# Patient Record
Sex: Male | Born: 1955 | Race: Black or African American | Hispanic: No | Smoking: Current some day smoker
Health system: Southern US, Community
[De-identification: ages and names within clinical notes are randomized; demographics above are authoritative.]

## PROBLEM LIST (undated history)

## (undated) DIAGNOSIS — F329 Major depressive disorder, single episode, unspecified: Secondary | ICD-10-CM

## (undated) DIAGNOSIS — E785 Hyperlipidemia, unspecified: Secondary | ICD-10-CM

## (undated) DIAGNOSIS — M545 Low back pain, unspecified: Secondary | ICD-10-CM

## (undated) DIAGNOSIS — F32A Depression, unspecified: Secondary | ICD-10-CM

## (undated) DIAGNOSIS — R7303 Prediabetes: Secondary | ICD-10-CM

## (undated) DIAGNOSIS — R4189 Other symptoms and signs involving cognitive functions and awareness: Secondary | ICD-10-CM

---

## 2004-06-30 ENCOUNTER — Ambulatory Visit: Payer: Self-pay

## 2004-07-03 ENCOUNTER — Ambulatory Visit: Payer: Self-pay | Admitting: Unknown Physician Specialty

## 2005-12-16 ENCOUNTER — Ambulatory Visit: Payer: Self-pay

## 2008-01-01 IMAGING — CR DG LUMBAR SPINE 2-3V
1 series · 3 of 3 positions shown · non-contrast
Comparison: none

REASON FOR EXAM: pain, Nilima Akter Torki [id number] ext. [id number]
COMMENTS:

PROCEDURE:     DXR - DXR LUMBAR SPINE AP AND LATERAL  - December 16, 2005 [DATE]
RESULT:      There does not appear to be evidence of fracture, dislocation
or malalignment.  If there is persistent clinical concern or persistent
complaints of pain, further evaluation with lumbar MRI is recommended.

[Series 1: view not recorded · 0.17mm/px · 3 of 3 slices shown]
[im 1/3]
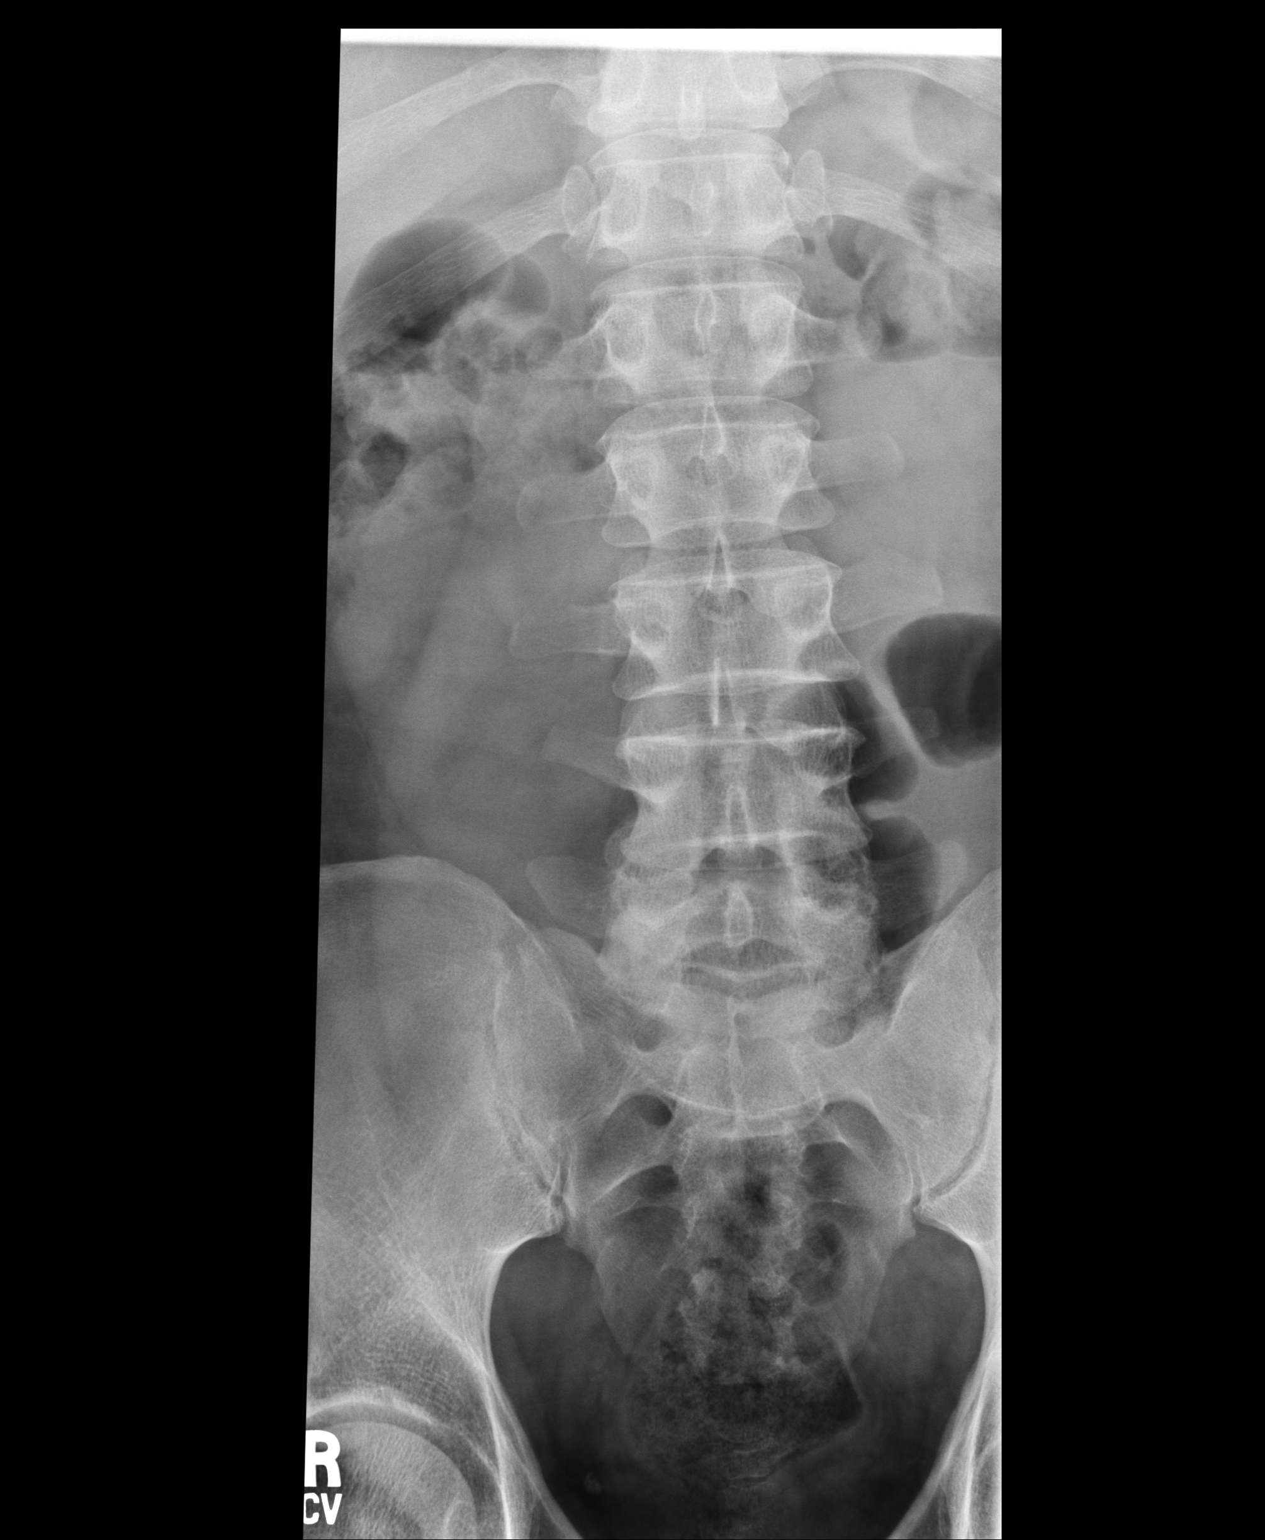
[im 2/3]
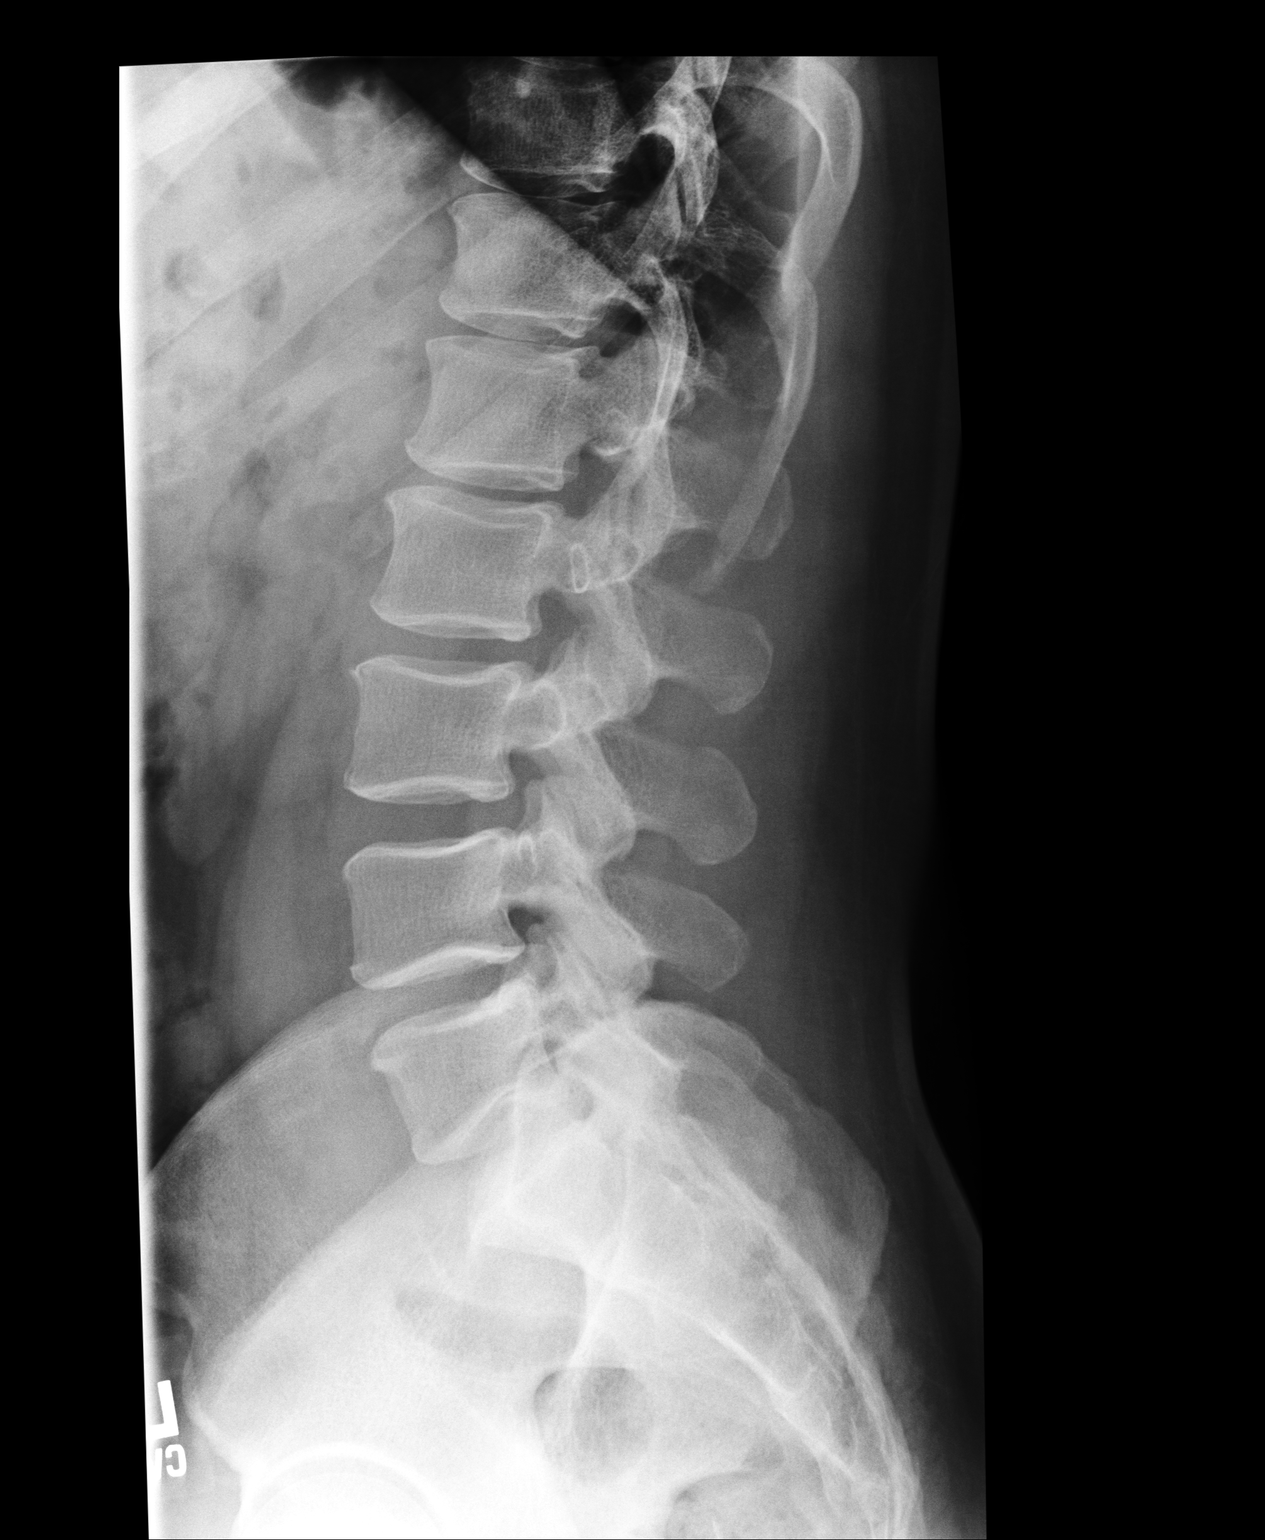
[im 3/3]
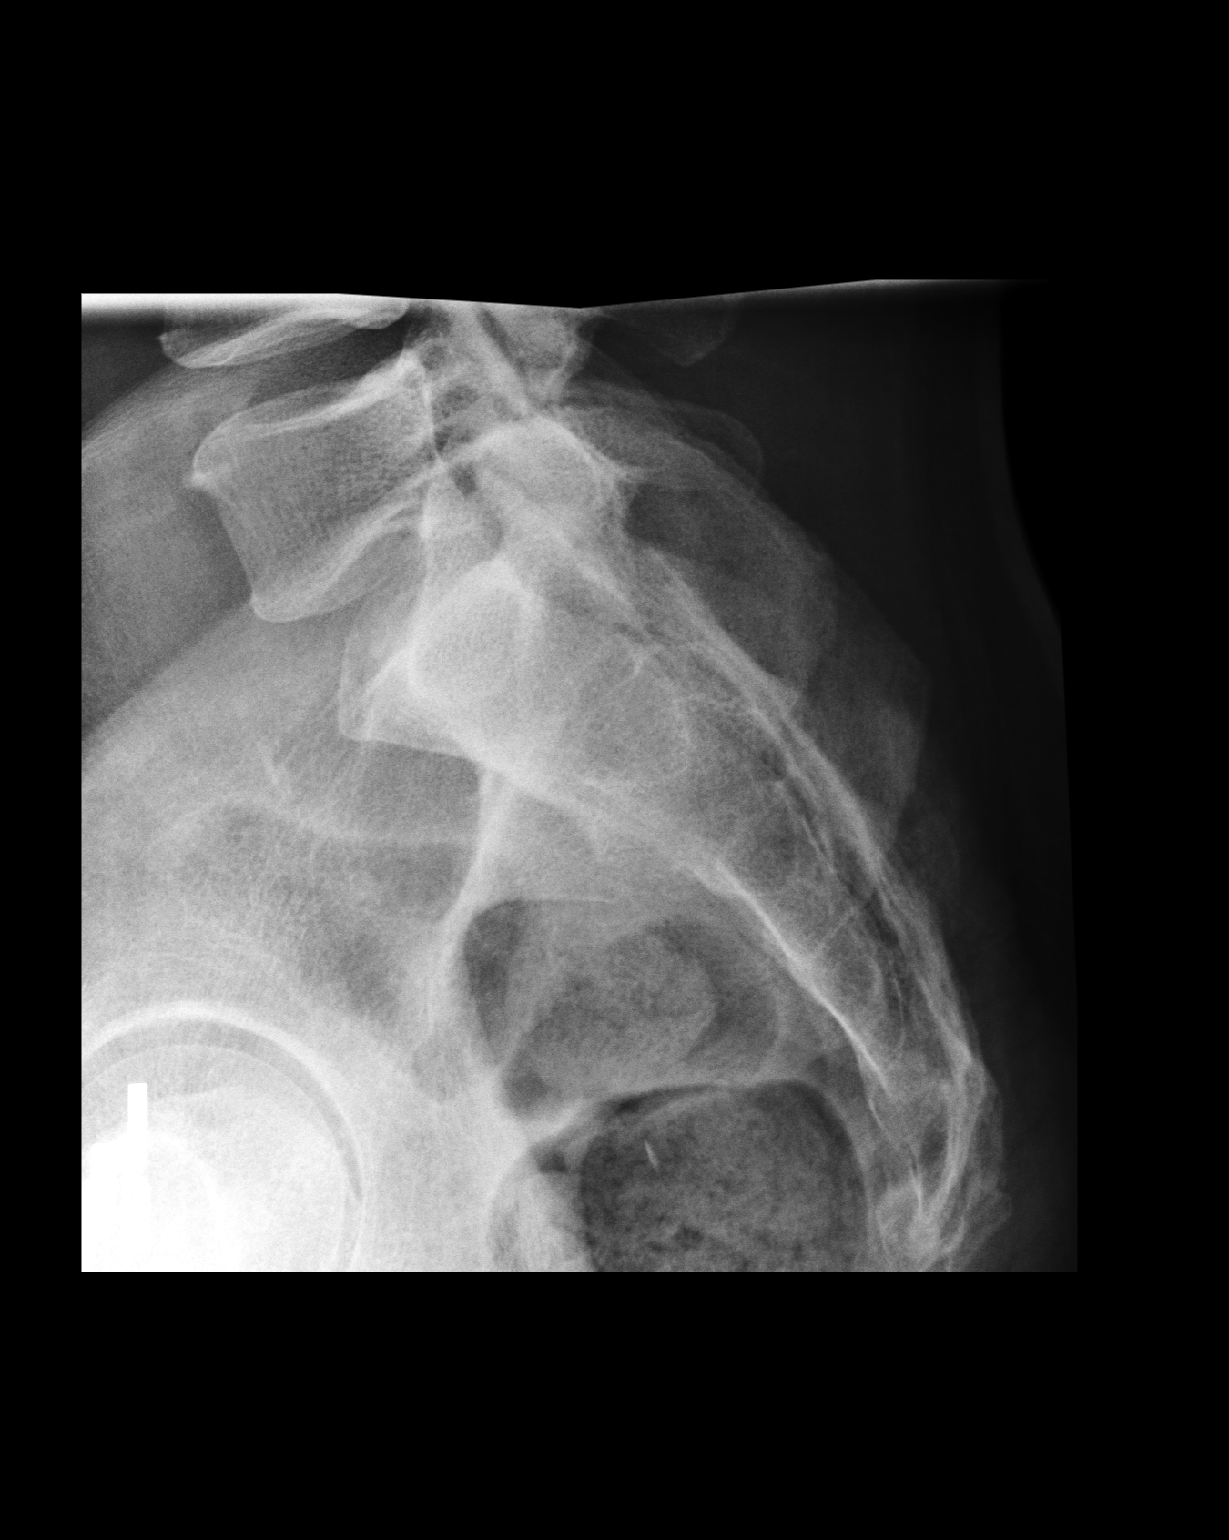

[3 of 3 positions shown; findings below may reference images not displayed]

IMPRESSION: Two views of the lumbar spine without evidence of acute osseous
abnormalities.

## 2008-01-24 ENCOUNTER — Ambulatory Visit: Payer: Self-pay

## 2016-01-19 ENCOUNTER — Ambulatory Visit
Admission: RE | Admit: 2016-01-19 | Payer: Medicare (Managed Care) | Source: Ambulatory Visit | Admitting: Unknown Physician Specialty

## 2016-01-19 ENCOUNTER — Encounter: Admission: RE | Payer: Self-pay | Source: Ambulatory Visit

## 2016-01-19 SURGERY — COLONOSCOPY WITH PROPOFOL
Anesthesia: General

## 2016-04-09 ENCOUNTER — Encounter: Payer: Self-pay | Admitting: *Deleted

## 2016-04-14 ENCOUNTER — Ambulatory Visit: Payer: Medicare (Managed Care) | Admitting: Certified Registered Nurse Anesthetist

## 2016-04-14 ENCOUNTER — Ambulatory Visit
Admission: RE | Admit: 2016-04-14 | Discharge: 2016-04-14 | Disposition: A | Discharge status: Home / Self Care | Payer: Medicare (Managed Care) | Source: Ambulatory Visit | Attending: Unknown Physician Specialty | Admitting: Unknown Physician Specialty

## 2016-04-14 ENCOUNTER — Encounter: Payer: Self-pay | Admitting: *Deleted

## 2016-04-14 ENCOUNTER — Encounter
Admission: RE | Disposition: A | Discharge status: Home / Self Care | Payer: Self-pay | Source: Ambulatory Visit | Attending: Unknown Physician Specialty

## 2016-04-14 DIAGNOSIS — Z791 Long term (current) use of non-steroidal anti-inflammatories (NSAID): Secondary | ICD-10-CM | POA: Insufficient documentation

## 2016-04-14 DIAGNOSIS — Z539 Procedure and treatment not carried out, unspecified reason: Secondary | ICD-10-CM | POA: Diagnosis not present

## 2016-04-14 DIAGNOSIS — Z6834 Body mass index (BMI) 34.0-34.9, adult: Secondary | ICD-10-CM | POA: Diagnosis not present

## 2016-04-14 DIAGNOSIS — F17290 Nicotine dependence, other tobacco product, uncomplicated: Secondary | ICD-10-CM | POA: Diagnosis not present

## 2016-04-14 DIAGNOSIS — R195 Other fecal abnormalities: Secondary | ICD-10-CM | POA: Insufficient documentation

## 2016-04-14 HISTORY — DX: Hyperlipidemia, unspecified: E78.5

## 2016-04-14 HISTORY — DX: Low back pain: M54.5

## 2016-04-14 HISTORY — DX: Prediabetes: R73.03

## 2016-04-14 HISTORY — DX: Other symptoms and signs involving cognitive functions and awareness: R41.89

## 2016-04-14 HISTORY — DX: Low back pain, unspecified: M54.50

## 2016-04-14 HISTORY — PX: COLONOSCOPY WITH PROPOFOL: SHX5780

## 2016-04-14 SURGERY — COLONOSCOPY WITH PROPOFOL
Anesthesia: General

## 2016-04-14 MED ORDER — SODIUM CHLORIDE 0.9 % IV SOLN: INTRAVENOUS | Status: DC

## 2016-04-14 MED ORDER — PROPOFOL 10 MG/ML IV BOLUS
INTRAVENOUS | Status: DC | PRN
  Administered 2016-04-14: 30 mg via INTRAVENOUS

## 2016-04-14 MED ORDER — SODIUM CHLORIDE 0.9 % IV SOLN
INTRAVENOUS | Status: DC
  Administered 2016-04-14: 1000 mL via INTRAVENOUS

## 2016-04-14 MED ORDER — MIDAZOLAM HCL 2 MG/2ML IJ SOLN
INTRAMUSCULAR | Status: DC | PRN
  Administered 2016-04-14 (×2): 1 mg via INTRAVENOUS

## 2016-04-14 MED ORDER — PROPOFOL 500 MG/50ML IV EMUL
INTRAVENOUS | Status: DC | PRN
  Administered 2016-04-14: 160 ug/kg/min via INTRAVENOUS

## 2016-04-14 MED ORDER — LIDOCAINE 2% (20 MG/ML) 5 ML SYRINGE
INTRAMUSCULAR | Status: AC
  Filled 2016-04-14: qty 5

## 2016-04-14 MED ORDER — MIDAZOLAM HCL 2 MG/2ML IJ SOLN
INTRAMUSCULAR | Status: AC
  Filled 2016-04-14: qty 2

## 2016-04-14 MED ORDER — LIDOCAINE HCL (CARDIAC) 20 MG/ML IV SOLN
INTRAVENOUS | Status: DC | PRN
  Administered 2016-04-14: 50 mg via INTRAVENOUS

## 2016-04-14 MED ORDER — PROPOFOL 500 MG/50ML IV EMUL
INTRAVENOUS | Status: AC
  Filled 2016-04-14: qty 50

## 2016-04-14 NOTE — Anesthesia Postprocedure Evaluation (Signed)
Anesthesia Post Note  Patient: Jason Baxter  Procedure(s) Performed: Procedure(s) (LRB): COLONOSCOPY WITH PROPOFOL (N/A)  Patient location during evaluation: PACU Anesthesia Type: General Level of consciousness: awake and alert Pain management: pain level controlled Vital Signs Assessment: post-procedure vital signs reviewed and stable Respiratory status: spontaneous breathing, nonlabored ventilation, respiratory function stable and patient connected to nasal cannula oxygen Cardiovascular status: blood pressure returned to baseline and stable Postop Assessment: no signs of nausea or vomiting Anesthetic complications: no     Last Vitals:  Vitals:   04/14/16 1537 04/14/16 1547  BP: (!) 94/44 (!) 101/55  Pulse: 64 (!) 57  Resp: 17 14  Temp: (!) 35.9 C     Last Pain:  Vitals:   04/14/16 1537  TempSrc: Tympanic                 Toney Lizaola S

## 2016-04-14 NOTE — Anesthesia Preprocedure Evaluation (Signed)
Anesthesia Evaluation  Patient identified by MRN, date of birth, ID band Patient awake    Reviewed: Allergy & Precautions, NPO status , Patient's Chart, lab work & pertinent test results  Airway Mallampati: I       Dental  (+) Teeth Intact   Pulmonary neg pulmonary ROS, Current Smoker,    Pulmonary exam normal        Cardiovascular Exercise Tolerance: Good  Rhythm:Regular Rate:Normal     Neuro/Psych    GI/Hepatic negative GI ROS, Neg liver ROS,   Endo/Other  negative endocrine ROSMorbid obesity  Renal/GU negative Renal ROS     Musculoskeletal negative musculoskeletal ROS (+)   Abdominal (+) + obese,   Peds negative pediatric ROS (+)  Hematology   Anesthesia Other Findings   Reproductive/Obstetrics                             Anesthesia Physical Anesthesia Plan  ASA: II  Anesthesia Plan: General   Post-op Pain Management:    Induction: Intravenous  Airway Management Planned: Natural Airway and Nasal Cannula  Additional Equipment:   Intra-op Plan:   Post-operative Plan:   Informed Consent: I have reviewed the patients History and Physical, chart, labs and discussed the procedure including the risks, benefits and alternatives for the proposed anesthesia with the patient or authorized representative who has indicated his/her understanding and acceptance.     Plan Discussed with: CRNA  Anesthesia Plan Comments:         Anesthesia Quick Evaluation

## 2016-04-14 NOTE — Op Note (Signed)
[facility] Gastroenterology Patient Name: Jason Baxter Procedure Date: 04/14/2016 3:20 PM MRN: [id number] Account #: [id number] Date of Birth: Feb 10, 1956 Admit Type: Outpatient Age: 60 Room: [facility] ENDO ROOM 4 Gender: Male Note Status: Finalized Procedure:            Colonoscopy Providers:            Manya Silvas, MD Referring MD:         Welford Roche, MD (Referring MD) Complications:        No immediate complications. Procedure:            Pre-Anesthesia Assessment:                       - After reviewing the risks and benefits, the patient                        was deemed in satisfactory condition to undergo the                        procedure.                       After obtaining informed consent, the colonoscope was                        passed under direct vision. Throughout the procedure,                        the patient's blood pressure, pulse, and oxygen                        saturations were monitored continuously. The                        Colonoscope was introduced through the anus with the                        intention of advancing to the cecum. The scope was                        advanced to the rectum before the procedure was                        aborted. Medications were given. The colonoscopy was                        aborted due to poor bowel prep with stool present. The                        colonoscopy was performed with ease. The patient                        tolerated the procedure well. The quality of the bowel                        preparation was unsatisfactory. Findings:      Thick layer of stool covering the rectum. Procedure aborted.      Semi-liquid stool was found in the rectum, precluding visualization. Impression:           - The procedure was  aborted due to poor bowel prep with                        stool present.                       - Preparation of the colon was unsatisfactory.                       -  No specimens collected. Recommendation:       - The findings and recommendations were discussed with                        the patient. Manya Silvas, MD 04/14/2016 3:35:24 PM This report has been signed electronically. Number of Addenda: 0 Note Initiated On: 04/14/2016 3:20 PM Total Procedure Duration: 0 hours 0 minutes 52 seconds       [facility]

## 2016-04-14 NOTE — Anesthesia Procedure Notes (Signed)
Date/Time: 04/14/2016 3:23 PM Performed by: Johnna Acosta Pre-anesthesia Checklist: Patient identified, Emergency Drugs available, Suction available, Patient being monitored and Timeout performed Patient Re-evaluated:Patient Re-evaluated prior to inductionOxygen Delivery Method: Nasal cannula

## 2016-04-14 NOTE — Transfer of Care (Signed)
Immediate Anesthesia Transfer of Care Note  Patient: Jason Baxter  Procedure(s) Performed: Procedure(s): COLONOSCOPY WITH PROPOFOL (N/A)  Patient Location: PACU  Anesthesia Type:General  Level of Consciousness: awake  Airway & Oxygen Therapy: Patient Spontanous Breathing and Patient connected to nasal cannula oxygen  Post-op Assessment: Report given to RN and Post -op Vital signs reviewed and stable  Post vital signs: Reviewed and stable  Last Vitals:  Vitals:   04/14/16 1323 04/14/16 1537  BP: 123/74 (!) 94/44  Pulse: 67 64  Resp: 20 17  Temp: (!) 35.9 C (!) 35.9 C    Last Pain:  Vitals:   04/14/16 1537  TempSrc: Tympanic         Complications: No apparent anesthesia complications

## 2016-04-14 NOTE — H&P (Signed)
   Primary Care Physician:  No PCP Per Patient Primary Gastroenterologist:  Dr. Vira Agar  Pre-Procedure History & Physical: HPI:  Jason Baxter is a 60 y.o. male is here for an colonoscopy.   Past Medical History:  Diagnosis Date  . Cognitive impairment   . Hyperlipemia   . Low back pain   . Pre-diabetes     History reviewed. No pertinent surgical history.  Prior to Admission medications   Medication Sig Start Date End Date Taking? Authorizing Provider  acetaminophen (TYLENOL) 500 MG tablet Take 500 mg by mouth every 6 (six) hours as needed.   Yes Historical Provider, MD  naproxen sodium (ANAPROX) 220 MG tablet Take 220 mg by mouth 2 (two) times daily with a meal.   Yes Historical Provider, MD  polyethylene glycol powder (GLYCOLAX/MIRALAX) powder Take 1 Container by mouth once.   Yes Historical Provider, MD    Allergies as of 04/05/2016  . (Not on File)    History reviewed. No pertinent family history.  Social History   Social History  . Marital status: Divorced    Spouse name: N/A  . Number of children: N/A  . Years of education: N/A   Occupational History  . Not on file.   Social History Main Topics  . Smoking status: Current Every Day Smoker    Types: Cigars  . Smokeless tobacco: Never Used  . Alcohol use No  . Drug use: No  . Sexual activity: Not on file   Other Topics Concern  . Not on file   Social History Narrative  . No narrative on file    Review of Systems: See HPI, otherwise negative ROS  Physical Exam: BP 123/74   Pulse 67   Temp (!) 96.6 F (35.9 C) (Tympanic)   Resp 20   Ht 6' (1.829 m)   Wt 115.7 kg (255 lb)   SpO2 99%   BMI 34.58 kg/m  General:   Alert,  pleasant and cooperative in NAD Head:  Normocephalic and atraumatic. Neck:  Supple; no masses or thyromegaly. Lungs:  Clear throughout to auscultation.    Heart:  Regular rate and rhythm. Abdomen:  Soft, nontender and nondistended. Normal bowel sounds, without guarding, and  without rebound.   Neurologic:  Alert and  oriented x4;  grossly normal neurologically.  Impression/Plan: Jason Baxter is here for an colonoscopy to be performed for heme positive stool.  Risks, benefits, limitations, and alternatives regarding  colonoscopy have been reviewed with the patient.  Questions have been answered.  All parties agreeable.   Gaylyn Cheers, MD  04/14/2016, 3:18 PM

## 2016-04-15 ENCOUNTER — Encounter: Payer: Self-pay | Admitting: Unknown Physician Specialty

## 2016-04-20 ENCOUNTER — Encounter: Payer: Self-pay | Admitting: *Deleted

## 2016-04-21 ENCOUNTER — Ambulatory Visit: Payer: Medicare (Managed Care) | Admitting: Certified Registered"

## 2016-04-21 ENCOUNTER — Ambulatory Visit
Admission: RE | Admit: 2016-04-21 | Discharge: 2016-04-21 | Disposition: A | Discharge status: Home / Self Care | Payer: Medicare (Managed Care) | Source: Ambulatory Visit | Attending: Unknown Physician Specialty | Admitting: Unknown Physician Specialty

## 2016-04-21 ENCOUNTER — Ambulatory Visit: Payer: Medicare (Managed Care) | Admitting: Anesthesiology

## 2016-04-21 ENCOUNTER — Ambulatory Visit
Admission: RE | Admit: 2016-04-21 | Payer: Medicare (Managed Care) | Source: Ambulatory Visit | Admitting: Unknown Physician Specialty

## 2016-04-21 ENCOUNTER — Encounter
Admission: RE | Disposition: A | Discharge status: Home / Self Care | Payer: Self-pay | Source: Ambulatory Visit | Attending: Unknown Physician Specialty

## 2016-04-21 ENCOUNTER — Encounter: Payer: Self-pay | Admitting: *Deleted

## 2016-04-21 DIAGNOSIS — R195 Other fecal abnormalities: Secondary | ICD-10-CM | POA: Insufficient documentation

## 2016-04-21 DIAGNOSIS — E785 Hyperlipidemia, unspecified: Secondary | ICD-10-CM | POA: Diagnosis not present

## 2016-04-21 DIAGNOSIS — F17290 Nicotine dependence, other tobacco product, uncomplicated: Secondary | ICD-10-CM | POA: Diagnosis not present

## 2016-04-21 DIAGNOSIS — F329 Major depressive disorder, single episode, unspecified: Secondary | ICD-10-CM | POA: Diagnosis not present

## 2016-04-21 DIAGNOSIS — D123 Benign neoplasm of transverse colon: Secondary | ICD-10-CM | POA: Insufficient documentation

## 2016-04-21 DIAGNOSIS — D122 Benign neoplasm of ascending colon: Secondary | ICD-10-CM | POA: Diagnosis not present

## 2016-04-21 HISTORY — DX: Depression, unspecified: F32.A

## 2016-04-21 HISTORY — PX: COLONOSCOPY WITH PROPOFOL: SHX5780

## 2016-04-21 HISTORY — DX: Major depressive disorder, single episode, unspecified: F32.9

## 2016-04-21 SURGERY — COLONOSCOPY WITH PROPOFOL
Anesthesia: General

## 2016-04-21 MED ORDER — PROPOFOL 500 MG/50ML IV EMUL
INTRAVENOUS | Status: AC
  Filled 2016-04-21: qty 50

## 2016-04-21 MED ORDER — LIDOCAINE HCL (CARDIAC) 20 MG/ML IV SOLN
INTRAVENOUS | Status: DC | PRN
  Administered 2016-04-21: 100 mg via INTRAVENOUS

## 2016-04-21 MED ORDER — PROPOFOL 10 MG/ML IV BOLUS
INTRAVENOUS | Status: DC | PRN
  Administered 2016-04-21: 50 mg via INTRAVENOUS

## 2016-04-21 MED ORDER — PROPOFOL 10 MG/ML IV BOLUS
INTRAVENOUS | Status: AC
  Filled 2016-04-21: qty 20

## 2016-04-21 MED ORDER — PHENYLEPHRINE HCL 10 MG/ML IJ SOLN
INTRAMUSCULAR | Status: DC | PRN
  Administered 2016-04-21: 120 ug via INTRAVENOUS
  Administered 2016-04-21: 80 ug via INTRAVENOUS
  Administered 2016-04-21: 120 ug via INTRAVENOUS

## 2016-04-21 MED ORDER — SODIUM CHLORIDE 0.9 % IV SOLN: INTRAVENOUS | Status: DC

## 2016-04-21 MED ORDER — PHENYLEPHRINE 40 MCG/ML (10ML) SYRINGE FOR IV PUSH (FOR BLOOD PRESSURE SUPPORT)
PREFILLED_SYRINGE | INTRAVENOUS | Status: AC
  Filled 2016-04-21: qty 10

## 2016-04-21 MED ORDER — LIDOCAINE 2% (20 MG/ML) 5 ML SYRINGE
INTRAMUSCULAR | Status: AC
  Filled 2016-04-21: qty 5

## 2016-04-21 MED ORDER — SODIUM CHLORIDE 0.9 % IV SOLN
INTRAVENOUS | Status: DC
Start: 2016-04-21 — End: 2016-04-21
  Administered 2016-04-21 (×2): via INTRAVENOUS
  Administered 2016-04-21: 1000 mL via INTRAVENOUS

## 2016-04-21 MED ORDER — PROPOFOL 500 MG/50ML IV EMUL
INTRAVENOUS | Status: DC | PRN
  Administered 2016-04-21: 150 ug/kg/min via INTRAVENOUS

## 2016-04-21 MED ORDER — PEG 3350-KCL-NA BICARB-NACL 420 G PO SOLR
4000.0000 mL | Freq: Once | ORAL | Status: DC
  Filled 2016-04-21: qty 4000

## 2016-04-21 MED ORDER — PHENYLEPHRINE HCL 10 MG/ML IJ SOLN
INTRAMUSCULAR | Status: DC | PRN
  Administered 2016-04-21: 120 ug via INTRAVENOUS

## 2016-04-21 NOTE — H&P (Signed)
   Primary Care Physician:  No PCP Per Patient Primary Gastroenterologist:  Dr. Vira Agar  Pre-Procedure History & Physical: HPI:  Jason Baxter is a 61 y.o. male is here for an colonoscopy.   Past Medical History:  Diagnosis Date  . Cognitive impairment   . Depression   . Hyperlipemia   . Low back pain   . Pre-diabetes     Past Surgical History:  Procedure Laterality Date  . COLONOSCOPY WITH PROPOFOL N/A 04/14/2016   Procedure: COLONOSCOPY WITH PROPOFOL;  Surgeon: Manya Silvas, MD;  Location: [facility] ENDOSCOPY;  Service: Endoscopy;  Laterality: N/A;    Prior to Admission medications   Medication Sig Start Date End Date Taking? Authorizing Provider  acetaminophen (TYLENOL) 500 MG tablet Take 500 mg by mouth every 6 (six) hours as needed.    Historical Provider, MD  naproxen sodium (ANAPROX) 220 MG tablet Take 220 mg by mouth 2 (two) times daily with a meal.    Historical Provider, MD  polyethylene glycol powder (GLYCOLAX/MIRALAX) powder Take 1 Container by mouth once.    Historical Provider, MD    Allergies as of 04/20/2016  . (No Known Allergies)    History reviewed. No pertinent family history.  Social History   Social History  . Marital status: Divorced    Spouse name: N/A  . Number of children: N/A  . Years of education: N/A   Occupational History  . Not on file.   Social History Main Topics  . Smoking status: Current Some Day Smoker    Types: Cigars  . Smokeless tobacco: Never Used  . Alcohol use No  . Drug use: No  . Sexual activity: Not on file   Other Topics Concern  . Not on file   Social History Narrative  . No narrative on file    Review of Systems: See HPI, otherwise negative ROS  Physical Exam: BP 127/76   Pulse 65   Temp 97.7 F (36.5 C) (Tympanic)   Resp 16   Ht 6' (1.829 m)   Wt 115.7 kg (255 lb)   SpO2 100%   BMI 34.58 kg/m  General:   Alert,  pleasant and cooperative in NAD Head:  Normocephalic and atraumatic. Neck:   Supple; no masses or thyromegaly. Lungs:  Clear throughout to auscultation.    Heart:  Regular rate and rhythm. Abdomen:  Soft, nontender and nondistended. Normal bowel sounds, without guarding, and without rebound.   Neurologic:  Alert and  oriented x4;  grossly normal neurologically.  Impression/Plan: Jason Baxter is here for an colonoscopy to be performed for Heme positive stool.  Risks, benefits, limitations, and alternatives regarding  colonoscopy have been reviewed with the patient.  Questions have been answered.  All parties agreeable.   Gaylyn Cheers, MD  04/21/2016, 8:26 AM

## 2016-04-21 NOTE — Transfer of Care (Signed)
Immediate Anesthesia Transfer of Care Note  Patient: Jason Baxter  Procedure(s) Performed: Procedure(s): COLONOSCOPY WITH PROPOFOL (N/A)  Patient Location: Endoscopy Unit  Anesthesia Type:General  Level of Consciousness: awake, oriented and patient cooperative  Airway & Oxygen Therapy: Patient Spontanous Breathing and Patient connected to nasal cannula oxygen  Post-op Assessment: Report given to RN, Post -op Vital signs reviewed and stable and Patient moving all extremities X 4  Post vital signs: Reviewed and stable  Last Vitals:  Vitals:   04/21/16 0858 04/21/16 0903  BP: 100/63 100/63  Pulse:    Resp: 13   Temp: 36.4 C     Last Pain:  Vitals:   04/21/16 0930  TempSrc:   PainSc: 0-No pain         Complications: No apparent anesthesia complications

## 2016-04-21 NOTE — Anesthesia Postprocedure Evaluation (Signed)
Anesthesia Post Note  Patient: Jason Baxter  Procedure(s) Performed: Procedure(s) (LRB): COLONOSCOPY WITH PROPOFOL (N/A)  Patient location during evaluation: Endoscopy Anesthesia Type: General Level of consciousness: awake and alert Pain management: pain level controlled Vital Signs Assessment: post-procedure vital signs reviewed and stable Respiratory status: spontaneous breathing, nonlabored ventilation, respiratory function stable and patient connected to nasal cannula oxygen Cardiovascular status: blood pressure returned to baseline and stable Postop Assessment: no signs of nausea or vomiting Anesthetic complications: no     Last Vitals:  Vitals:   04/21/16 0858 04/21/16 0903  BP: 100/63 100/63  Pulse:    Resp: 13   Temp: 36.4 C     Last Pain:  Vitals:   04/21/16 0930  TempSrc:   PainSc: 0-No pain                 Precious Haws Piscitello

## 2016-04-21 NOTE — Op Note (Signed)
[facility] Gastroenterology Patient Name: Jason Baxter Procedure Date: 04/21/2016 8:27 AM MRN: [id number] Account #: [id number] Date of Birth: 06-Mar-1956 Admit Type: Outpatient Age: 61 Room: [facility] ENDO ROOM 4 Gender: Male Note Status: Finalized Procedure:            Colonoscopy Providers:            Manya Silvas, MD Medicines:            Propofol per Anesthesia Complications:        No immediate complications. Procedure:            Pre-Anesthesia Assessment:                       - After reviewing the risks and benefits, the patient                        was deemed in satisfactory condition to undergo the                        procedure.                       After obtaining informed consent, the colonoscope was                        passed under direct vision. Throughout the procedure,                        the patient's blood pressure, pulse, and oxygen                        saturations were monitored continuously. The                        Colonoscope was introduced through the anus and                        advanced to the the cecum, identified by appendiceal                        orifice and ileocecal valve. The colonoscopy was                        somewhat difficult due to inadequate bowel prep. The                        patient tolerated the procedure well. The quality of                        the bowel preparation was poor. Findings:      A 10 mm polyp was found in the descending colon. The polyp was sessile.       Due to the poor prep we were not able to remove it for fear of gas       explosion. There was stool throughout the colon. Some chuncks and mostly       covering of colon with semi-liquid stool. Impression:           - Preparation of the colon was poor.                       -  One 10 mm polyp in the descending colon. Recommendation:       - The findings and recommendations were discussed with                        the  patient's family. Recommend drink prep this morning                        and do colonoscopy in later afternoon. Manya Silvas, MD 04/21/2016 8:56:24 AM This report has been signed electronically. Number of Addenda: 0 Note Initiated On: 04/21/2016 8:27 AM Scope Withdrawal Time: 0 hours 9 minutes 50 seconds  Total Procedure Duration: 0 hours 15 minutes 38 seconds       [facility]

## 2016-04-21 NOTE — Anesthesia Preprocedure Evaluation (Signed)
Anesthesia Evaluation  Patient identified by MRN, date of birth, ID band Patient awake    Reviewed: Allergy & Precautions, H&P , NPO status , Patient's Chart, lab work & pertinent test results  History of Anesthesia Complications Negative for: history of anesthetic complications  Airway Mallampati: III  TM Distance: >3 FB Neck ROM: full    Dental  (+) Poor Dentition, Missing, Edentulous Upper, Edentulous Lower   Pulmonary neg shortness of breath, Current Smoker,    Pulmonary exam normal breath sounds clear to auscultation       Cardiovascular Exercise Tolerance: Good (-) angina(-) Past MI and (-) DOE negative cardio ROS Normal cardiovascular exam Rhythm:regular Rate:Normal     Neuro/Psych PSYCHIATRIC DISORDERS negative neurological ROS     GI/Hepatic negative GI ROS, Neg liver ROS, neg GERD  ,  Endo/Other  negative endocrine ROS  Renal/GU negative Renal ROS  negative genitourinary   Musculoskeletal   Abdominal   Peds  Hematology negative hematology ROS (+)   Anesthesia Other Findings Signs and symptoms suggestive of sleep apnea   Past Medical History: No date: Cognitive impairment No date: Depression No date: Hyperlipemia No date: Low back pain No date: Pre-diabetes  Past Surgical History: 04/14/2016: COLONOSCOPY WITH PROPOFOL N/A     Comment: Procedure: COLONOSCOPY WITH PROPOFOL;                Surgeon: Manya Silvas, MD;  Location: [facility]               ENDOSCOPY;  Service: Endoscopy;  Laterality:               N/A;  BMI    Body Mass Index:  34.58 kg/m      Reproductive/Obstetrics negative OB ROS                             Anesthesia Physical Anesthesia Plan  ASA: III  Anesthesia Plan: General   Post-op Pain Management:    Induction:   Airway Management Planned:   Additional Equipment:   Intra-op Plan:   Post-operative Plan:   Informed Consent: I have  reviewed the patients History and Physical, chart, labs and discussed the procedure including the risks, benefits and alternatives for the proposed anesthesia with the patient or authorized representative who has indicated his/her understanding and acceptance.   Dental Advisory Given  Plan Discussed with: Anesthesiologist, CRNA and Surgeon  Anesthesia Plan Comments:         Anesthesia Quick Evaluation

## 2016-04-21 NOTE — Op Note (Signed)
[facility] Gastroenterology Patient Name: Jason Baxter Procedure Date: 04/21/2016 3:22 PM MRN: [id number] Account #: [id number] Date of Birth: 1956-03-07 Admit Type: Outpatient Age: 61 Room: [facility] ENDO ROOM 4 Gender: Male Note Status: Finalized Procedure:            Colonoscopy Indications:          Heme positive stool Providers:            Manya Silvas, MD Medicines:            Propofol per Anesthesia Complications:        No immediate complications. Procedure:            Pre-Anesthesia Assessment:                       - After reviewing the risks and benefits, the patient                        was deemed in satisfactory condition to undergo the                        procedure.                       After obtaining informed consent, the colonoscope was                        passed under direct vision. Throughout the procedure,                        the patient's blood pressure, pulse, and oxygen                        saturations were monitored continuously. The                        Colonoscope was introduced through the anus and                        advanced to the the cecum, identified by appendiceal                        orifice and ileocecal valve. The colonoscopy was                        performed without difficulty. The patient tolerated the                        procedure well. The quality of the bowel preparation                        was excellent. Findings:      A diminutive polyp was found in the ascending colon. The polyp was       sessile. The polyp was removed with a jumbo cold forceps. Resection and       retrieval were complete.      A diminutive polyp was found in the ascending colon. The polyp was       sessile. The polyp was removed with a jumbo cold forceps. Resection and       retrieval were complete.      A diminutive polyp was found in  the transverse colon. The polyp was       sessile. The polyp was removed with a jumbo  cold forceps. Resection and       retrieval were complete.      A diminutive polyp was found in the transverse colon. The polyp was       sessile. The polyp was removed with a jumbo cold forceps. Resection and       retrieval were complete.      A small polyp was found in the distal transverse colon. The polyp was       sessile. The polyp was removed with a hot snare. Resection and retrieval       were complete.      Four sessile polyps were found in the distal transverse colon. The       polyps were diminutive in size. These polyps were removed with a jumbo       cold forceps. Resection and retrieval were complete.      A 10 mm polyp was found in the splenic flexure. The polyp was sessile.       The polyp was removed with a hot snare. Resection and retrieval were       complete. To prevent bleeding after the polypectomy, one hemostatic clip       was successfully placed. There was no bleeding during, or at the end, of       the procedure.      A diminutive polyp was found in the splenic flexure. The polyp was       sessile. The polyp was removed with a jumbo cold forceps. Resection and       retrieval were complete.      A diminutive polyp was found in the descending colon. The polyp was       sessile. The polyp was removed with a jumbo cold forceps. Resection and       retrieval were complete.      Four sessile polyps were found in the rectum. The polyps were diminutive       in size. These polyps were removed with a jumbo cold forceps. Resection       and retrieval were complete. Impression:           - One diminutive polyp in the ascending colon, removed                        with a jumbo cold forceps. Resected and retrieved.                       - One diminutive polyp in the ascending colon, removed                        with a jumbo cold forceps. Resected and retrieved.                       - One diminutive polyp in the transverse colon, removed                        with a  jumbo cold forceps. Resected and retrieved.                       - One diminutive polyp in the transverse colon, removed  with a jumbo cold forceps. Resected and retrieved.                       - One small polyp in the distal transverse colon,                        removed with a hot snare. Resected and retrieved.                       - Four diminutive polyps in the distal transverse                        colon, removed with a jumbo cold forceps. Resected and                        retrieved.                       - One 10 mm polyp at the splenic flexure, removed with                        a hot snare. Resected and retrieved. Clip was placed.                       - One diminutive polyp at the splenic flexure, removed                        with a jumbo cold forceps. Resected and retrieved.                       - One diminutive polyp in the descending colon, removed                        with a jumbo cold forceps. Resected and retrieved.                       - Four diminutive polyps in the rectum, removed with a                        jumbo cold forceps. Resected and retrieved. Recommendation:       - Await pathology results. Manya Silvas, MD 04/21/2016 4:15:26 PM This report has been signed electronically. Number of Addenda: 0 Note Initiated On: 04/21/2016 3:22 PM Scope Withdrawal Time: 0 hours 28 minutes 48 seconds  Total Procedure Duration: 0 hours 36 minutes 34 seconds       [facility]

## 2016-04-21 NOTE — Transfer of Care (Signed)
Immediate Anesthesia Transfer of Care Note  Patient: Jason Baxter  Procedure(s) Performed: Procedure(s): COLONOSCOPY WITH PROPOFOL (N/A)  Patient Location: Endoscopy Unit  Anesthesia Type:General  Level of Consciousness: awake, oriented and patient cooperative  Airway & Oxygen Therapy: Patient Spontanous Breathing and Patient connected to nasal cannula oxygen  Post-op Assessment: Report given to RN, Post -op Vital signs reviewed and stable and Patient moving all extremities X 4  Post vital signs: Reviewed and stable  Last Vitals:  Vitals:   04/21/16 0804  BP: 127/76  Pulse: 65  Resp: 16  Temp: 36.5 C    Last Pain:  Vitals:   04/21/16 0804  TempSrc: Tympanic         Complications: No apparent anesthesia complications

## 2016-04-22 ENCOUNTER — Encounter: Payer: Self-pay | Admitting: Unknown Physician Specialty

## 2016-04-23 LAB — SURGICAL PATHOLOGY

## 2016-04-27 NOTE — Anesthesia Preprocedure Evaluation (Signed)
Anesthesia Evaluation  Patient identified by MRN, date of birth, ID band Patient awake    Reviewed: Allergy & Precautions, H&P , NPO status , Patient's Chart, lab work & pertinent test results, reviewed documented beta blocker date and time   Airway Mallampati: II   Neck ROM: full    Dental  (+) Poor Dentition   Pulmonary neg pulmonary ROS, Current Smoker,    Pulmonary exam normal        Cardiovascular negative cardio ROS Normal cardiovascular exam Rhythm:regular Rate:Normal     Neuro/Psych negative neurological ROS  negative psych ROS   GI/Hepatic negative GI ROS, Neg liver ROS,   Endo/Other  negative endocrine ROS  Renal/GU negative Renal ROS  negative genitourinary   Musculoskeletal   Abdominal   Peds  Hematology negative hematology ROS (+)   Anesthesia Other Findings Past Medical History: No date: Cognitive impairment No date: Depression No date: Hyperlipemia No date: Low back pain No date: Pre-diabetes Past Surgical History: 04/14/2016: COLONOSCOPY WITH PROPOFOL N/A     Comment: Procedure: COLONOSCOPY WITH PROPOFOL;                Surgeon: Manya Silvas, MD;  Location: [facility]               ENDOSCOPY;  Service: Endoscopy;  Laterality:               N/A; 04/21/2016: COLONOSCOPY WITH PROPOFOL N/A     Comment: Procedure: COLONOSCOPY WITH PROPOFOL;                Surgeon: Manya Silvas, MD;  Location: [facility]               ENDOSCOPY;  Service: Endoscopy;  Laterality:               N/A; 04/21/2016: COLONOSCOPY WITH PROPOFOL N/A     Comment: Procedure: COLONOSCOPY WITH PROPOFOL;                Surgeon: Manya Silvas, MD;  Location: [facility]               ENDOSCOPY;  Service: Endoscopy;  Laterality:               N/A; BMI    Body Mass Index:  34.58 kg/m     Reproductive/Obstetrics negative OB ROS                             Anesthesia Physical Anesthesia Plan  ASA:  II  Anesthesia Plan: General   Post-op Pain Management:    Induction:   Airway Management Planned:   Additional Equipment:   Intra-op Plan:   Post-operative Plan:   Informed Consent: I have reviewed the patients History and Physical, chart, labs and discussed the procedure including the risks, benefits and alternatives for the proposed anesthesia with the patient or authorized representative who has indicated his/her understanding and acceptance.   Dental Advisory Given  Plan Discussed with: CRNA  Anesthesia Plan Comments:         Anesthesia Quick Evaluation

## 2016-04-27 NOTE — Anesthesia Postprocedure Evaluation (Signed)
Anesthesia Post Note  Patient: Jason Baxter  Procedure(s) Performed: Procedure(s) (LRB): COLONOSCOPY WITH PROPOFOL (N/A)  Patient location during evaluation: PACU Anesthesia Type: General Level of consciousness: awake and alert Pain management: pain level controlled Vital Signs Assessment: post-procedure vital signs reviewed and stable Respiratory status: spontaneous breathing, nonlabored ventilation, respiratory function stable and patient connected to nasal cannula oxygen Cardiovascular status: blood pressure returned to baseline and stable Postop Assessment: no signs of nausea or vomiting Anesthetic complications: no     Last Vitals:  Vitals:   04/21/16 1634 04/21/16 1644  BP: 123/81 140/90  Pulse: 64 (!) 50  Resp: 16 18  Temp:      Last Pain:  Vitals:   04/22/16 0805  TempSrc:   PainSc: 0-No pain                 Molli Barrows

## 2017-08-08 ENCOUNTER — Encounter: Admission: RE | Payer: Self-pay | Source: Ambulatory Visit

## 2017-08-08 ENCOUNTER — Ambulatory Visit
Admission: RE | Admit: 2017-08-08 | Payer: Medicaid Other | Source: Ambulatory Visit | Admitting: Unknown Physician Specialty

## 2017-08-08 SURGERY — COLONOSCOPY WITH PROPOFOL
Anesthesia: General

## 2017-10-13 ENCOUNTER — Encounter: Payer: Self-pay | Admitting: *Deleted

## 2017-10-14 ENCOUNTER — Encounter: Admission: RE | Payer: Self-pay | Source: Ambulatory Visit

## 2017-10-14 ENCOUNTER — Ambulatory Visit
Admission: RE | Admit: 2017-10-14 | Payer: Medicare (Managed Care) | Source: Ambulatory Visit | Admitting: Unknown Physician Specialty

## 2017-10-14 HISTORY — DX: Hyperlipidemia, unspecified: E78.5

## 2017-10-14 SURGERY — COLONOSCOPY WITH PROPOFOL
Anesthesia: General

## 2018-12-14 ENCOUNTER — Other Ambulatory Visit
Admission: RE | Admit: 2018-12-14 | Discharge: 2018-12-14 | Disposition: A | Discharge status: Home / Self Care | Payer: Medicare (Managed Care) | Source: Ambulatory Visit | Attending: Internal Medicine | Admitting: Internal Medicine

## 2018-12-14 ENCOUNTER — Other Ambulatory Visit: Payer: Self-pay

## 2018-12-14 DIAGNOSIS — Z01812 Encounter for preprocedural laboratory examination: Secondary | ICD-10-CM | POA: Diagnosis present

## 2018-12-14 DIAGNOSIS — Z20828 Contact with and (suspected) exposure to other viral communicable diseases: Secondary | ICD-10-CM | POA: Insufficient documentation

## 2018-12-14 LAB — SARS CORONAVIRUS 2 (TAT 6-24 HRS): SARS Coronavirus 2: NEGATIVE

## 2018-12-15 ENCOUNTER — Encounter: Payer: Self-pay | Admitting: *Deleted

## 2018-12-17 NOTE — H&P (Signed)
  Outpatient short stay form Pre-procedure 12/17/2018 5:46 PM Taliah Porche K. Alice Reichert, M.D.  Primary Physician: None documented  Reason for visit: Personal history of adenomatous colon polyps  History of present illness: 63 year old male with a history of major depression and cognitive impairment, low back pain on narcotic analgesics presents for colon polyp surveillance.  Last colonoscopy in January 2018 revealed 15 diminutive polyps, some of which had mild adenomatous change.   No current facility-administered medications for this encounter.   Current Outpatient Medications:  .  hydrochlorothiazide (MICROZIDE) 12.5 MG capsule, Take 12.5 mg by mouth daily., Disp: , Rfl:  .  acetaminophen (TYLENOL) 500 MG tablet, Take 500 mg by mouth every 6 (six) hours as needed., Disp: , Rfl:  .  aspirin EC 81 MG tablet, Take 81 mg by mouth daily., Disp: , Rfl:  .  naproxen sodium (ANAPROX) 220 MG tablet, Take 220 mg by mouth 2 (two) times daily with a meal., Disp: , Rfl:  .  polyethylene glycol powder (GLYCOLAX/MIRALAX) powder, Take 1 Container by mouth once., Disp: , Rfl:   No medications prior to admission.     No Known Allergies   Past Medical History:  Diagnosis Date  . Cognitive impairment   . Depression   . Dyslipidemia   . Hyperlipemia   . Low back pain   . Pre-diabetes     Review of systems:  Otherwise negative.    Physical Exam  Gen: Alert, oriented. Appears stated age.  HEENT: [place]/AT. PERRLA. Lungs: CTA, no wheezes. CV: RR nl S1, S2. Abd: soft, benign, no masses. BS+ Ext: No edema. Pulses 2+    Planned procedures: Proceed with colonoscopy. The patient understands the nature of the planned procedure, indications, risks, alternatives and potential complications including but not limited to bleeding, infection, perforation, damage to internal organs and possible oversedation/side effects from anesthesia. The patient agrees and gives consent to proceed.  Please refer to procedure  notes for findings, recommendations and patient disposition/instructions.     Dorothy Landgrebe K. Alice Reichert, M.D. Gastroenterology 12/17/2018  5:46 PM

## 2018-12-18 ENCOUNTER — Other Ambulatory Visit: Payer: Self-pay

## 2018-12-18 ENCOUNTER — Encounter: Payer: Self-pay | Admitting: Internal Medicine

## 2018-12-18 ENCOUNTER — Ambulatory Visit: Payer: Medicare (Managed Care) | Admitting: Anesthesiology

## 2018-12-18 ENCOUNTER — Encounter
Admission: RE | Disposition: A | Discharge status: Home / Self Care | Payer: Self-pay | Source: Home / Self Care | Attending: Internal Medicine

## 2018-12-18 ENCOUNTER — Ambulatory Visit
Admission: RE | Admit: 2018-12-18 | Discharge: 2018-12-18 | Disposition: A | Discharge status: Home / Self Care | Payer: Medicare (Managed Care) | Attending: Internal Medicine | Admitting: Internal Medicine

## 2018-12-18 DIAGNOSIS — K6389 Other specified diseases of intestine: Secondary | ICD-10-CM | POA: Insufficient documentation

## 2018-12-18 DIAGNOSIS — Z791 Long term (current) use of non-steroidal anti-inflammatories (NSAID): Secondary | ICD-10-CM | POA: Diagnosis not present

## 2018-12-18 DIAGNOSIS — M545 Low back pain: Secondary | ICD-10-CM | POA: Diagnosis not present

## 2018-12-18 DIAGNOSIS — Z7982 Long term (current) use of aspirin: Secondary | ICD-10-CM | POA: Diagnosis not present

## 2018-12-18 DIAGNOSIS — F329 Major depressive disorder, single episode, unspecified: Secondary | ICD-10-CM | POA: Insufficient documentation

## 2018-12-18 DIAGNOSIS — R7303 Prediabetes: Secondary | ICD-10-CM | POA: Diagnosis not present

## 2018-12-18 DIAGNOSIS — Z86010 Personal history of colonic polyps: Secondary | ICD-10-CM | POA: Insufficient documentation

## 2018-12-18 DIAGNOSIS — K635 Polyp of colon: Secondary | ICD-10-CM | POA: Insufficient documentation

## 2018-12-18 DIAGNOSIS — Z79899 Other long term (current) drug therapy: Secondary | ICD-10-CM | POA: Insufficient documentation

## 2018-12-18 DIAGNOSIS — E782 Mixed hyperlipidemia: Secondary | ICD-10-CM | POA: Diagnosis not present

## 2018-12-18 HISTORY — PX: COLONOSCOPY WITH PROPOFOL: SHX5780

## 2018-12-18 SURGERY — COLONOSCOPY WITH PROPOFOL
Anesthesia: General

## 2018-12-18 MED ORDER — PROPOFOL 10 MG/ML IV BOLUS
INTRAVENOUS | Status: DC | PRN
  Administered 2018-12-18: 30 mg via INTRAVENOUS
  Administered 2018-12-18: 20 mg via INTRAVENOUS
  Administered 2018-12-18: 50 mg via INTRAVENOUS
  Administered 2018-12-18: 20 mg via INTRAVENOUS
  Administered 2018-12-18: 50 mg via INTRAVENOUS
  Administered 2018-12-18: 30 mg via INTRAVENOUS

## 2018-12-18 MED ORDER — LIDOCAINE HCL (PF) 2 % IJ SOLN
INTRAMUSCULAR | Status: AC
  Filled 2018-12-18: qty 10

## 2018-12-18 MED ORDER — PROPOFOL 10 MG/ML IV BOLUS
INTRAVENOUS | Status: AC
  Filled 2018-12-18: qty 20

## 2018-12-18 MED ORDER — LIDOCAINE HCL (CARDIAC) PF 100 MG/5ML IV SOSY
PREFILLED_SYRINGE | INTRAVENOUS | Status: DC | PRN
  Administered 2018-12-18: 40 mg via INTRAVENOUS

## 2018-12-18 MED ORDER — PHENYLEPHRINE HCL (PRESSORS) 10 MG/ML IV SOLN
INTRAVENOUS | Status: DC | PRN
  Administered 2018-12-18: 50 ug via INTRAVENOUS

## 2018-12-18 MED ORDER — SODIUM CHLORIDE 0.9 % IV SOLN
INTRAVENOUS | Status: DC
  Administered 2018-12-18: 11:00:00 via INTRAVENOUS

## 2018-12-18 MED ORDER — PROPOFOL 500 MG/50ML IV EMUL
INTRAVENOUS | Status: DC | PRN
  Administered 2018-12-18: 70 ug/kg/min via INTRAVENOUS

## 2018-12-18 NOTE — Interval H&P Note (Signed)
History and Physical Interval Note:  12/18/2018 10:07 AM  Jason Baxter  has presented today for surgery, with the diagnosis of PERSONAL HX.OF COLON POLYPS.  The various methods of treatment have been discussed with the patient and family. After consideration of risks, benefits and other options for treatment, the patient has consented to  Procedure(s): COLONOSCOPY WITH PROPOFOL (N/A) as a surgical intervention.  The patient's history has been reviewed, patient examined, no change in status, stable for surgery.  I have reviewed the patient's chart and labs.  Questions were answered to the patient's satisfaction.     [place], [place]

## 2018-12-18 NOTE — Anesthesia Post-op Follow-up Note (Signed)
Anesthesia QCDR form completed.        

## 2018-12-18 NOTE — Op Note (Signed)
[facility] Gastroenterology Patient Name: Jason Baxter Procedure Date: 12/18/2018 10:55 AM MRN: [id number] Account #: [id number] Date of Birth: 01-24-1956 Admit Type: Outpatient Age: 63 Room: [facility] ENDO ROOM 1 Gender: Male Note Status: Finalized Procedure:            Colonoscopy Indications:          Surveillance: Personal history of adenomatous polyps on                        last colonoscopy 5 years ago Providers:            Lorie Apley K. Alice Reichert MD, MD Referring MD:         No Local Md, MD (Referring MD) Medicines:            Propofol per Anesthesia Complications:        No immediate complications. Procedure:            Pre-Anesthesia Assessment:                       - The risks and benefits of the procedure and the                        sedation options and risks were discussed with the                        patient. All questions were answered and informed                        consent was obtained.                       - Patient identification and proposed procedure were                        verified prior to the procedure by the nurse. The                        procedure was verified in the procedure room.                       - ASA Grade Assessment: III - A patient with severe                        systemic disease.                       - After reviewing the risks and benefits, the patient                        was deemed in satisfactory condition to undergo the                        procedure.                       After obtaining informed consent, the colonoscope was                        passed under direct vision. Throughout the procedure,  the patient's blood pressure, pulse, and oxygen                        saturations were monitored continuously. The                        Colonoscope was introduced through the anus and                        advanced to the the cecum, identified by appendiceal       orifice and ileocecal valve. The colonoscopy was                        performed without difficulty. The patient tolerated the                        procedure well. The quality of the bowel preparation                        was adequate. The ileocecal valve, appendiceal orifice,                        and rectum were photographed. Findings:      The perianal and digital rectal examinations were normal. Pertinent       negatives include normal sphincter tone and no palpable rectal lesions.      A 6 mm polyp was found in the cecum. The polyp was semi-pedunculated.       The polyp was removed with a cold snare. Resection and retrieval were       complete.      Two sessile polyps were found in the descending colon and ascending       colon. The polyps were 3 to 4 mm in size. These polyps were removed with       a jumbo cold forceps. Resection and retrieval were complete.      The exam was otherwise without abnormality on direct and retroflexion       views. Impression:           - One 6 mm polyp in the cecum, removed with a cold                        snare. Resected and retrieved.                       - Two 3 to 4 mm polyps in the descending colon and in                        the ascending colon, removed with a jumbo cold forceps.                        Resected and retrieved.                       - The examination was otherwise normal on direct and                        retroflexion views. Recommendation:       - Patient has a contact number available for  emergencies. The signs and symptoms of potential                        delayed complications were discussed with the patient.                        Return to normal activities tomorrow. Written discharge                        instructions were provided to the patient.                       - Resume previous diet.                       - Continue present medications.                       - Repeat  colonoscopy is recommended for surveillance.                        The colonoscopy date will be determined after pathology                        results from today's exam become available for review.                       - Return to GI office PRN.                       - The findings and recommendations were discussed with                        the patient. Procedure Code(s):    --- Professional ---                       [phone], Colonoscopy, flexible; with removal of tumor(s),                        polyp(s), or other lesion(s) by snare technique                       [id number], 74, Colonoscopy, flexible; with biopsy, single                        or multiple Diagnosis Code(s):    --- Professional ---                       K63.5, Polyp of colon                       Z86.010, Personal history of colonic polyps CPT copyright 2019 [employer]. All rights reserved. The codes documented in this report are preliminary and upon coder review may  be revised to meet current compliance requirements. Efrain Sella MD, MD 12/18/2018 11:24:09 AM This report has been signed electronically. Number of Addenda: 0 Note Initiated On: 12/18/2018 10:55 AM Scope Withdrawal Time: 0 hours 8 minutes 21 seconds  Total Procedure Duration: 0 hours 15 minutes 51 seconds  Estimated Blood Loss: Estimated blood loss: none.      [facility]

## 2018-12-18 NOTE — Transfer of Care (Signed)
Immediate Anesthesia Transfer of Care Note  Patient: Jason Baxter  Procedure(s) Performed: COLONOSCOPY WITH PROPOFOL (N/A )  Patient Location: PACU  Anesthesia Type:General  Level of Consciousness: sedated  Airway & Oxygen Therapy: Patient Spontanous Breathing  Post-op Assessment: Report given to RN and Post -op Vital signs reviewed and stable  Post vital signs: Reviewed and stable  Last Vitals:  Vitals Value Taken Time  BP 103/66 12/18/18 1123  Temp    Pulse 62 12/18/18 1123  Resp 15 12/18/18 1123  SpO2 95 % 12/18/18 1123  Vitals shown include unvalidated device data.  Last Pain:  Vitals:   12/18/18 1123  TempSrc:   PainSc: Asleep         Complications: No apparent anesthesia complications

## 2018-12-18 NOTE — Anesthesia Preprocedure Evaluation (Signed)
Anesthesia Evaluation  Patient identified by MRN, date of birth, ID band Patient awake    Reviewed: Allergy & Precautions, H&P , NPO status , Patient's Chart, lab work & pertinent test results, reviewed documented beta blocker date and time   Airway Mallampati: II   Neck ROM: full    Dental  (+) Poor Dentition   Pulmonary neg pulmonary ROS, Current Smoker and Patient abstained from smoking.,    Pulmonary exam normal        Cardiovascular Exercise Tolerance: Poor negative cardio ROS Normal cardiovascular exam Rhythm:regular Rate:Normal     Neuro/Psych PSYCHIATRIC DISORDERS Depression negative neurological ROS  negative psych ROS   GI/Hepatic negative GI ROS, Neg liver ROS,   Endo/Other  negative endocrine ROS  Renal/GU negative Renal ROS  negative genitourinary   Musculoskeletal   Abdominal   Peds  Hematology negative hematology ROS (+)   Anesthesia Other Findings Past Medical History: No date: Cognitive impairment No date: Depression No date: Dyslipidemia No date: Hyperlipemia No date: Low back pain No date: Pre-diabetes Past Surgical History: 04/14/2016: COLONOSCOPY WITH PROPOFOL; N/A     Comment:  Procedure: COLONOSCOPY WITH PROPOFOL;  Surgeon: Manya Silvas, MD;  Location: [facility] ENDOSCOPY;  Service:               Endoscopy;  Laterality: N/A; 04/21/2016: COLONOSCOPY WITH PROPOFOL; N/A     Comment:  Procedure: COLONOSCOPY WITH PROPOFOL;  Surgeon: Manya Silvas, MD;  Location: [facility] ENDOSCOPY;  Service:               Endoscopy;  Laterality: N/A; 04/21/2016: COLONOSCOPY WITH PROPOFOL; N/A     Comment:  Procedure: COLONOSCOPY WITH PROPOFOL;  Surgeon: Manya Silvas, MD;  Location: [facility] ENDOSCOPY;  Service:               Endoscopy;  Laterality: N/A; BMI    Body Mass Index: 33.23 kg/m     Reproductive/Obstetrics negative OB ROS                              Anesthesia Physical Anesthesia Plan  ASA: III  Anesthesia Plan: General   Post-op Pain Management:    Induction:   PONV Risk Score and Plan:   Airway Management Planned:   Additional Equipment:   Intra-op Plan:   Post-operative Plan:   Informed Consent: I have reviewed the patients History and Physical, chart, labs and discussed the procedure including the risks, benefits and alternatives for the proposed anesthesia with the patient or authorized representative who has indicated his/her understanding and acceptance.     Dental Advisory Given  Plan Discussed with: CRNA  Anesthesia Plan Comments:         Anesthesia Quick Evaluation

## 2018-12-18 NOTE — OR Nursing (Signed)
IV dc'd from right hand, catheter intact, site without redness or swelling.

## 2018-12-20 LAB — SURGICAL PATHOLOGY

## 2018-12-20 NOTE — Anesthesia Postprocedure Evaluation (Signed)
Anesthesia Post Note  Patient: Jason Baxter  Procedure(s) Performed: COLONOSCOPY WITH PROPOFOL (N/A )  Patient location during evaluation: PACU Anesthesia Type: General Level of consciousness: awake and alert Pain management: pain level controlled Vital Signs Assessment: post-procedure vital signs reviewed and stable Respiratory status: spontaneous breathing, nonlabored ventilation, respiratory function stable and patient connected to nasal cannula oxygen Cardiovascular status: blood pressure returned to baseline and stable Postop Assessment: no apparent nausea or vomiting Anesthetic complications: no     Last Vitals:  Vitals:   12/18/18 1133 12/18/18 1143  BP: 105/66 116/85  Pulse: (!) 53 (!) 58  Resp: 15 16  Temp:    SpO2: 97% 99%    Last Pain:  Vitals:   12/19/18 0719  TempSrc:   PainSc: 0-No pain                 Molli Barrows

## 2019-08-03 ENCOUNTER — Other Ambulatory Visit: Payer: Self-pay

## 2019-08-03 ENCOUNTER — Ambulatory Visit (INDEPENDENT_AMBULATORY_CARE_PROVIDER_SITE_OTHER): Payer: Medicaid Other | Admitting: Urology

## 2019-08-03 VITALS — BP 148/81 | HR 88 | Ht 72.0 in | Wt 265.0 lb

## 2019-08-03 DIAGNOSIS — N3941 Urge incontinence: Secondary | ICD-10-CM

## 2019-08-03 DIAGNOSIS — R3129 Other microscopic hematuria: Secondary | ICD-10-CM

## 2019-08-03 LAB — MICROSCOPIC EXAMINATION
Bacteria, UA: NONE SEEN
WBC, UA: NONE SEEN /hpf (ref 0–5)

## 2019-08-03 LAB — URINALYSIS, COMPLETE
Bilirubin, UA: NEGATIVE
Glucose, UA: NEGATIVE
Ketones, UA: NEGATIVE
Leukocytes,UA: NEGATIVE
Nitrite, UA: NEGATIVE
Protein,UA: NEGATIVE
Specific Gravity, UA: >1.03 — ABNORMAL HIGH (ref 1.005–1.030)
Urobilinogen, Ur: 2 mg/dL — ABNORMAL HIGH (ref 0.2–1.0)
pH, UA: 6 (ref 5.0–7.5)

## 2019-08-03 LAB — BLADDER SCAN AMB NON-IMAGING: Scan Result: 36

## 2019-08-03 NOTE — Patient Instructions (Signed)
Hematuria, Adult Hematuria is blood in the urine. Blood may be visible in the urine, or it may be identified with a test. This condition can be caused by infections of the bladder, urethra, kidney, or prostate. Other possible causes include:  Kidney stones.  Cancer of the urinary tract.  Too much calcium in the urine.  Conditions that are passed from parent to child (inherited conditions).  Exercise that requires a lot of energy. Infections can usually be treated with medicine, and a kidney stone usually will pass through your urine. If neither of these is the cause of your hematuria, more tests may be needed to identify the cause of your symptoms. It is very important to tell your health care provider about any blood in your urine, even if it is painless or the blood stops without treatment. Blood in the urine, when it happens and then stops and then happens again, can be a symptom of a very serious condition, including cancer. There is no pain in the initial stages of many urinary cancers. Follow these instructions at home: Medicines  Take over-the-counter and prescription medicines only as told by your health care provider.  If you were prescribed an antibiotic medicine, take it as told by your health care provider. Do not stop taking the antibiotic even if you start to feel better. Eating and drinking  Drink enough fluid to keep your urine clear or pale yellow. It is recommended that you drink 3-4 quarts (2.8-3.8 L) a day. If you have been diagnosed with an infection, it is recommended that you drink cranberry juice in addition to large amounts of water.  Avoid caffeine, tea, and carbonated beverages. These tend to irritate the bladder.  Avoid alcohol because it may irritate the prostate (men). General instructions  If you have been diagnosed with a kidney stone, follow your health care provider's instructions about straining your urine to catch the stone.  Empty your bladder  often. Avoid holding urine for long periods of time.  If you are male: ? After a bowel movement, wipe from front to back and use each piece of toilet paper only once. ? Empty your bladder before and after sex.  Pay attention to any changes in your symptoms. Tell your health care provider about any changes or any new symptoms.  It is your responsibility to get your test results. Ask your health care provider, or the department performing the test, when your results will be ready.  Keep all follow-up visits as told by your health care provider. This is important. Contact a health care provider if:  You develop back pain.  You have a fever.  You have nausea or vomiting.  Your symptoms do not improve after 3 days.  Your symptoms get worse. Get help right away if:  You develop severe vomiting and are unable take medicine without vomiting.  You develop severe pain in your back or abdomen even though you are taking medicine.  You pass a large amount of blood in your urine.  You pass blood clots in your urine.  You feel very weak or like you might faint.  You faint. Summary  Hematuria is blood in the urine. It has many possible causes.  It is very important that you tell your health care provider about any blood in your urine, even if it is painless or the blood stops without treatment.  Take over-the-counter and prescription medicines only as told by your health care provider.  Drink enough fluid to keep   your urine clear or pale yellow. This information is not intended to replace advice given to you by your health care provider. Make sure you discuss any questions you have with your health care provider. Document Revised: 08/30/2018 Document Reviewed: 05/08/2016 Elsevier Patient Education  2020 [employer]. Cystoscopy Cystoscopy is a procedure that is used to help diagnose and sometimes treat conditions that affect the lower urinary tract. The lower urinary tract includes  the bladder and the urethra. The urethra is the tube that drains urine from the bladder. Cystoscopy is done using a thin, tube-shaped instrument with a light and camera at the end (cystoscope). The cystoscope may be hard or flexible, depending on the goal of the procedure. The cystoscope is inserted through the urethra, into the bladder. Cystoscopy may be recommended if you have:  Urinary tract infections that keep coming back.  Blood in the urine (hematuria).  An inability to control when you urinate (urinary incontinence) or an overactive bladder.  Unusual cells found in a urine sample.  A blockage in the urethra, such as a urinary stone.  Painful urination.  An abnormality in the bladder found during an intravenous pyelogram (IVP) or CT scan. Cystoscopy may also be done to remove a sample of tissue to be examined under a microscope (biopsy). Tell a health care provider about:  Any allergies you have.  All medicines you are taking, including vitamins, herbs, eye drops, creams, and over-the-counter medicines.  Any problems you or family members have had with anesthetic medicines.  Any blood disorders you have.  Any surgeries you have had.  Any medical conditions you have.  Whether you are pregnant or may be pregnant. What are the risks? Generally, this is a safe procedure. However, problems may occur, including:  Infection.  Bleeding.  Allergic reactions to medicines.  Damage to other structures or organs. What happens before the procedure?  Ask your health care provider about: ? Changing or stopping your regular medicines. This is especially important if you are taking diabetes medicines or blood thinners. ? Taking medicines such as aspirin and ibuprofen. These medicines can thin your blood. Do not take these medicines unless your health care provider tells you to take them. ? Taking over-the-counter medicines, vitamins, herbs, and supplements.  Follow instructions  from your health care provider about eating or drinking restrictions.  Ask your health care provider what steps will be taken to help prevent infection. These may include: ? Washing skin with a germ-killing soap. ? Taking antibiotic medicine.  You may have an exam or testing, such as: ? X-rays of the bladder, urethra, or kidneys. ? Urine tests to check for signs of infection.  Plan to have someone take you home from the hospital or clinic. What happens during the procedure?   You will be given one or more of the following: ? A medicine to help you relax (sedative). ? A medicine to numb the area (local anesthetic).  The area around the opening of your urethra will be cleaned.  The cystoscope will be passed through your urethra into your bladder.  Germ-free (sterile) fluid will flow through the cystoscope to fill your bladder. The fluid will stretch your bladder so that your health care provider can clearly examine your bladder walls.  Your doctor will look at the urethra and bladder. Your doctor may take a biopsy or remove stones.  The cystoscope will be removed, and your bladder will be emptied. The procedure may vary among health care providers and hospitals.  What can I expect after the procedure? After the procedure, it is common to have:  Some soreness or pain in your abdomen and urethra.  Urinary symptoms. These include: ? Mild pain or burning when you urinate. Pain should stop within a few minutes after you urinate. This may last for up to 1 week. ? A small amount of blood in your urine for several days. ? Feeling like you need to urinate but producing only a small amount of urine. Follow these instructions at home: Medicines  Take over-the-counter and prescription medicines only as told by your health care provider.  If you were prescribed an antibiotic medicine, take it as told by your health care provider. Do not stop taking the antibiotic even if you start to feel  better. General instructions  Return to your normal activities as told by your health care provider. Ask your health care provider what activities are safe for you.  Do not drive for 24 hours if you were given a sedative during your procedure.  Watch for any blood in your urine. If the amount of blood in your urine increases, call your health care provider.  Follow instructions from your health care provider about eating or drinking restrictions.  If a tissue sample was removed for testing (biopsy) during your procedure, it is up to you to get your test results. Ask your health care provider, or the department that is doing the test, when your results will be ready.  Drink enough fluid to keep your urine pale yellow.  Keep all follow-up visits as told by your health care provider. This is important. Contact a health care provider if you:  Have pain that gets worse or does not get better with medicine, especially pain when you urinate.  Have trouble urinating.  Have more blood in your urine. Get help right away if you:  Have blood clots in your urine.  Have abdominal pain.  Have a fever or chills.  Are unable to urinate. Summary  Cystoscopy is a procedure that is used to help diagnose and sometimes treat conditions that affect the lower urinary tract.  Cystoscopy is done using a thin, tube-shaped instrument with a light and camera at the end.  After the procedure, it is common to have some soreness or pain in your abdomen and urethra.  Watch for any blood in your urine. If the amount of blood in your urine increases, call your health care provider.  If you were prescribed an antibiotic medicine, take it as told by your health care provider. Do not stop taking the antibiotic even if you start to feel better. This information is not intended to replace advice given to you by your health care provider. Make sure you discuss any questions you have with your health care  provider. Document Revised: 03/28/2018 Document Reviewed: 03/28/2018 Elsevier Patient Education  [employer].

## 2019-08-03 NOTE — Progress Notes (Signed)
08/03/2019 10:22 AM   Aleen Sells 10-14-1955 [id number]  Referring provider: Janna Arch, [address] [place],  [place] [address]  Urge incontinence   HPI:  Pt seen today for urge incontinence for a few months. Stream is good. He has MH today with 3-10 rbcs per hpf. No gross hematuria. Years ago dye exposure. He smokes cigars. No NG risk. PVR 36 ml.     PMH: Past Medical History:  Diagnosis Date  . Cognitive impairment   . Depression   . Dyslipidemia   . Hyperlipemia   . Low back pain   . Pre-diabetes     Surgical History: Past Surgical History:  Procedure Laterality Date  . COLONOSCOPY WITH PROPOFOL N/A 04/14/2016   Procedure: COLONOSCOPY WITH PROPOFOL;  Surgeon: Manya Silvas, MD;  Location: [facility] ENDOSCOPY;  Service: Endoscopy;  Laterality: N/A;  . COLONOSCOPY WITH PROPOFOL N/A 04/21/2016   Procedure: COLONOSCOPY WITH PROPOFOL;  Surgeon: Manya Silvas, MD;  Location: [facility] ENDOSCOPY;  Service: Endoscopy;  Laterality: N/A;  . COLONOSCOPY WITH PROPOFOL N/A 04/21/2016   Procedure: COLONOSCOPY WITH PROPOFOL;  Surgeon: Manya Silvas, MD;  Location: [facility] ENDOSCOPY;  Service: Endoscopy;  Laterality: N/A;  . COLONOSCOPY WITH PROPOFOL N/A 12/18/2018   Procedure: COLONOSCOPY WITH PROPOFOL;  Surgeon: Toledo, Benay Pike, MD;  Location: [facility] ENDOSCOPY;  Service: Gastroenterology;  Laterality: N/A;    Home Medications:  Allergies as of 08/03/2019   No Known Allergies     Medication List       Accurate as of August 03, 2019 10:22 AM. If you have any questions, ask your nurse or doctor.        acetaminophen 500 MG tablet Commonly known as: TYLENOL Take 500 mg by mouth every 6 (six) hours as needed.   aspirin EC 81 MG tablet Take 81 mg by mouth daily.   hydrochlorothiazide 12.5 MG capsule Commonly known as: MICROZIDE Take 12.5 mg by mouth daily.   naproxen sodium 220 MG tablet Commonly known as: ALEVE Take 220 mg by mouth 2 (two) times daily with a  meal.   polyethylene glycol powder 17 GM/SCOOP powder Commonly known as: GLYCOLAX/MIRALAX Take 1 Container by mouth once.       Allergies: No Known Allergies  Family History: No family history on file.  Social History:  reports that he has been smoking pipe. He has never used smokeless tobacco. He reports that he does not drink alcohol or use drugs.   Physical Exam: There were no vitals taken for this visit.  Constitutional:  Alert and oriented, No acute distress. HEENT:  AT, moist mucus membranes.  Trachea midline, no masses. Cardiovascular: No clubbing, cyanosis, or edema. Respiratory: Normal respiratory effort, no increased work of breathing. GI: Abdomen is soft, nontender, nondistended, no abdominal masses GU: No CVA tenderness; prostate benign, 30 grams, no hard area or nodule  Lymph: No cervical or inguinal lymphadenopathy. Skin: No rashes, bruises or suspicious lesions. Neurologic: Grossly intact, no focal deficits, moving all 4 extremities. Psychiatric: Normal mood and affect.  Laboratory Data: No results found for: WBC, HGB, HCT, MCV, PLT  No results found for: CREATININE  No results found for: PSA  No results found for: TESTOSTERONE  No results found for: HGBA1C  Urinalysis No results found for: COLORURINE, APPEARANCEUR, LABSPEC, PHURINE, GLUCOSEU, HGBUR, BILIRUBINUR, KETONESUR, PROTEINUR, UROBILINOGEN, NITRITE, LEUKOCYTESUR  No results found for: LABMICR, [place], RBCUA, LABEPIT, MUCUS, BACTERIA  Pertinent Imaging: n/a No results found for this or any previous visit. No  results found for this or any previous visit. No results found for this or any previous visit. No results found for this or any previous visit. No results found for this or any previous visit. No results found for this or any previous visit. No results found for this or any previous visit. No results found for this or any previous visit.  Assessment & Plan:    Microscopic hematuria  - given irritative symptoms and smoking will eval with CT scan and cystoscopy   Urge incontinence - eval as above which will help guide therapy. Also sent PSA   Check PSA today, CT A/P next available, follow up for cystoscopy after CT   No follow-ups on file.  Festus Aloe, MD  [facility] [address], [place] [address], [place] [address] [phone]

## 2019-08-15 ENCOUNTER — Other Ambulatory Visit: Payer: Self-pay | Admitting: Family Medicine

## 2019-08-15 DIAGNOSIS — R3121 Asymptomatic microscopic hematuria: Secondary | ICD-10-CM

## 2019-08-15 DIAGNOSIS — N3941 Urge incontinence: Secondary | ICD-10-CM

## 2019-08-31 ENCOUNTER — Encounter: Payer: Self-pay | Admitting: Urology

## 2019-08-31 ENCOUNTER — Ambulatory Visit (INDEPENDENT_AMBULATORY_CARE_PROVIDER_SITE_OTHER): Payer: Medicaid Other | Admitting: Urology

## 2019-08-31 ENCOUNTER — Other Ambulatory Visit: Payer: Self-pay

## 2019-08-31 VITALS — BP 128/73 | HR 74 | Ht 72.0 in | Wt 259.0 lb

## 2019-08-31 DIAGNOSIS — N3941 Urge incontinence: Secondary | ICD-10-CM

## 2019-08-31 DIAGNOSIS — R3129 Other microscopic hematuria: Secondary | ICD-10-CM

## 2019-08-31 DIAGNOSIS — N401 Enlarged prostate with lower urinary tract symptoms: Secondary | ICD-10-CM

## 2019-08-31 DIAGNOSIS — R350 Frequency of micturition: Secondary | ICD-10-CM

## 2019-08-31 MED ORDER — TAMSULOSIN HCL 0.4 MG PO CAPS: 0.4000 mg | ORAL_CAPSULE | Freq: Every day | ORAL | 3 refills | Status: AC

## 2019-08-31 NOTE — Patient Instructions (Signed)

## 2019-08-31 NOTE — Progress Notes (Signed)
   08/31/19  CC:  Chief Complaint  Patient presents with  . Cysto    HPI:  F/u today for cystoscopy. Seen Apr 2021 for urge incontinence for a few months. Stream is good. He had MH with 3-10 rbcs per hpf. No gross hematuria. Years ago dye exposure. He smokes cigars. No NG risk. PVR 36 ml. He did not have PSA done or get CT as ordered. However, he has a CT angio pending 09/05/2019 which will screen the GU tract as well.    There were no vitals taken for this visit. NED. A&Ox3.   No respiratory distress   Abd soft, NT, ND Normal phallus with bilateral descended testicles  Cystoscopy Procedure Note  Patient identification was confirmed, informed consent was obtained, and patient was prepped using Betadine solution.  Lidocaine jelly was administered per urethral meatus.     Pre-Procedure: - Inspection reveals a normal caliber ureteral meatus.  Procedure: The flexible cystoscope was introduced without difficulty - No urethral strictures/lesions are present. - Prostate short but tight BN obstructing - Tight, elevated bladder neck - Bilateral ureteral orifices identified - Bladder mucosa  reveals no ulcers, tumors, or lesions - No bladder stones - No trabeculation  Retroflexion shows normal bladder neck, prostate protrusion into bladder about 1 cm  Morey Hummingbird - assistant    Post-Procedure: - Patient tolerated the procedure well  Assessment/ Plan:  Microhematuria  - benign cysto today. CT pending.   BPH with LUTS, urgency - trial of tamsulosin .   No follow-ups on file.  Festus Aloe, MD

## 2019-09-01 LAB — MICROSCOPIC EXAMINATION
Bacteria, UA: NONE SEEN
Epithelial Cells (non renal): NONE SEEN /hpf (ref 0–10)

## 2019-09-01 LAB — URINALYSIS, COMPLETE
Bilirubin, UA: NEGATIVE
Glucose, UA: NEGATIVE
Ketones, UA: NEGATIVE
Leukocytes,UA: NEGATIVE
Nitrite, UA: NEGATIVE
Protein,UA: NEGATIVE
Specific Gravity, UA: >1.03 — ABNORMAL HIGH (ref 1.005–1.030)
Urobilinogen, Ur: 1 mg/dL (ref 0.2–1.0)
pH, UA: 5.5 (ref 5.0–7.5)

## 2019-09-05 ENCOUNTER — Ambulatory Visit
Admission: RE | Admit: 2019-09-05 | Discharge: 2019-09-05 | Disposition: A | Discharge status: Home / Self Care | Payer: Medicaid Other | Source: Ambulatory Visit | Attending: Family Medicine | Admitting: Family Medicine

## 2019-09-05 ENCOUNTER — Other Ambulatory Visit: Payer: Self-pay

## 2019-09-05 DIAGNOSIS — R3121 Asymptomatic microscopic hematuria: Secondary | ICD-10-CM | POA: Diagnosis not present

## 2019-09-05 DIAGNOSIS — N3941 Urge incontinence: Secondary | ICD-10-CM | POA: Insufficient documentation

## 2019-09-05 LAB — POCT I-STAT CREATININE: Creatinine, Ser: 1 mg/dL (ref 0.61–1.24)

## 2019-09-05 MED ORDER — IOHEXOL 300 MG/ML  SOLN
125.0000 mL | Freq: Once | INTRAMUSCULAR | Status: AC | PRN
  Administered 2019-09-05: 125 mL via INTRAVENOUS

## 2019-10-12 ENCOUNTER — Encounter: Payer: Self-pay | Admitting: Urology

## 2019-10-12 ENCOUNTER — Other Ambulatory Visit: Payer: Self-pay

## 2019-10-12 ENCOUNTER — Ambulatory Visit (INDEPENDENT_AMBULATORY_CARE_PROVIDER_SITE_OTHER): Payer: Medicaid Other | Admitting: Urology

## 2019-10-12 VITALS — BP 144/75 | HR 66 | Ht 72.0 in | Wt 263.0 lb

## 2019-10-12 DIAGNOSIS — N138 Other obstructive and reflux uropathy: Secondary | ICD-10-CM | POA: Diagnosis not present

## 2019-10-12 DIAGNOSIS — N401 Enlarged prostate with lower urinary tract symptoms: Secondary | ICD-10-CM

## 2019-10-12 DIAGNOSIS — R3129 Other microscopic hematuria: Secondary | ICD-10-CM | POA: Diagnosis not present

## 2019-10-12 DIAGNOSIS — N3941 Urge incontinence: Secondary | ICD-10-CM | POA: Diagnosis not present

## 2019-10-12 NOTE — Progress Notes (Signed)
10/12/2019 9:54 AM   Jason Baxter [birth year] [id number]  Referring provider: Janna Arch, [place] [address] [place],  [place] [address]  No chief complaint on file.   HPI:  F/u -   1) urge incontinence - 04/21 - for several months. Stream is good. No NG risk. PVR 36 ml. PSA ordered, not done. Cysto 05/21 with tight BN. Rx tamsulosin. Prostate about 50 grams on CT.   2) MH - UA with 3-10 rbcs per hpf 04/21. No gross hematuria. Years ago dye exposure. He smokes cigars. Cysto benign 05/21. CT angio done 05/21 with benign GU tract.   Today, he returns and is doing well. The urgency and the incontinence resolved. He does not think he is taking the tamsulosin. Nonetheless, he is doing well. No gross hematuria.     PMH: Past Medical History:  Diagnosis Date   Cognitive impairment    Depression    Dyslipidemia    Hyperlipemia    Low back pain    Pre-diabetes     Surgical History: Past Surgical History:  Procedure Laterality Date   COLONOSCOPY WITH PROPOFOL N/A 04/14/2016   Procedure: COLONOSCOPY WITH PROPOFOL;  Surgeon: Manya Silvas, MD;  Location: [place];  Service: Endoscopy;  Laterality: N/A;   COLONOSCOPY WITH PROPOFOL N/A 04/21/2016   Procedure: COLONOSCOPY WITH PROPOFOL;  Surgeon: Manya Silvas, MD;  Location: [facility] ENDOSCOPY;  Service: Endoscopy;  Laterality: N/A;   COLONOSCOPY WITH PROPOFOL N/A 04/21/2016   Procedure: COLONOSCOPY WITH PROPOFOL;  Surgeon: Manya Silvas, MD;  Location[facility] ENDOSCOPY;  Service: Endoscopy;  Laterality: N/A;   COLONOSCOPY WITH PROPOFOL N/A 12/18/2018   Procedure: COLONOSCOPY WITH PROPOFOL;  Surgeon: Toledo, Benay Pike, MD;  Location: [facility] ENDOSCOPY;  Service: Gastroenterology;  Laterality: N/A;    Home Medications:  Allergies as of 10/12/2019   No Known Allergies     Medication List       Accurate as of October 12, 2019  9:54 AM. If you have any questions, ask your nurse or doctor.        acetaminophen 500  MG tablet Commonly known as: TYLENOL Take 500 mg by mouth every 6 (six) hours as needed.   aspirin EC 81 MG tablet Take 81 mg by mouth daily.   hydrochlorothiazide 12.5 MG capsule Commonly known as: MICROZIDE Take 12.5 mg by mouth daily.   naproxen sodium 220 MG tablet Commonly known as: ALEVE Take 220 mg by mouth 2 (two) times daily with a meal.   polyethylene glycol powder 17 GM/SCOOP powder Commonly known as: GLYCOLAX/MIRALAX Take 1 Container by mouth once.   tamsulosin 0.4 MG Caps capsule Commonly known as: FLOMAX Take 1 capsule (0.4 mg total) by mouth daily after supper.       Allergies: No Known Allergies  Family History: No family history on file.  Social History:  reports that he has been smoking pipe. He has never used smokeless tobacco. He reports that he does not drink alcohol and does not use drugs.   Physical Exam: There were no vitals taken for this visit.  Constitutional:  Alert and oriented, No acute distress. HEENT: [place] AT, moist mucus membranes.  Trachea midline, no masses. Cardiovascular: No clubbing, cyanosis, or edema. Respiratory: Normal respiratory effort, no increased work of breathing. GI: Abdomen is soft, nontender, nondistended, no abdominal masses GU: No CVA tenderness Skin: No rashes, bruises or suspicious lesions. Neurologic: Grossly intact, no focal deficits, moving all 4 extremities. Psychiatric: Normal mood and affect.  Laboratory Data: No results found for: WBC, HGB, HCT, MCV, PLT  Lab Results  Component Value Date   CREATININE 1.00 09/05/2019    No results found for: PSA  No results found for: TESTOSTERONE  No results found for: HGBA1C  Urinalysis    Component Value Date/Time   APPEARANCEUR Clear 08/31/2019 1302   GLUCOSEU Negative 08/31/2019 1302   BILIRUBINUR Negative 08/31/2019 1302   PROTEINUR Negative 08/31/2019 1302   NITRITE Negative 08/31/2019 1302   LEUKOCYTESUR Negative 08/31/2019 1302    Lab Results    Component Value Date   LABMICR See below: 08/31/2019   WBCUA 0-5 08/31/2019   LABEPIT None seen 08/31/2019   BACTERIA None seen 08/31/2019    Pertinent Imaging: CT agnio images reviewed  No results found for this or any previous visit.  No results found for this or any previous visit.  No results found for this or any previous visit.  No results found for this or any previous visit.  No results found for this or any previous visit.  No results found for this or any previous visit.  No results found for this or any previous visit.  No results found for this or any previous visit.   Assessment & Plan:    MH -  No worrisome symptoms.   BPH, urgency - improved. Continue surveillance. PSA sent.   No follow-ups on file.  Festus Aloe, MD  [facility] [address] [place], [place] [address] [phone]

## 2019-10-12 NOTE — Patient Instructions (Signed)

## 2019-10-13 LAB — PSA: Prostate Specific Ag, Serum: 0.6 ng/mL (ref 0.0–4.0)

## 2019-10-18 ENCOUNTER — Telehealth: Payer: Self-pay

## 2019-10-18 NOTE — Telephone Encounter (Signed)
-----   Message from Festus Aloe, MD sent at 10/18/2019 12:08 PM EDT ----- Let Mr Cicero know PSA is normal. Looks good. Thanks.  ----- Message ----- From: Evelina Bucy, CMA Sent: 10/15/2019   8:06 AM EDT To: Festus Aloe, MD   ----- Message ----- From: Lavone Neri Lab Results In Sent: 10/13/2019   5:38 AM EDT To: Rowe [facility]

## 2019-10-18 NOTE — Telephone Encounter (Signed)
Notified patient as advised, patient verbalized understanding.  

## 2019-12-21 ENCOUNTER — Ambulatory Visit: Payer: Self-pay | Admitting: Urology

## 2019-12-21 ENCOUNTER — Encounter: Payer: Self-pay | Admitting: Urology

## 2020-08-01 ENCOUNTER — Other Ambulatory Visit: Payer: Self-pay | Admitting: Urology

## 2023-09-27 ENCOUNTER — Other Ambulatory Visit: Payer: Self-pay | Admitting: Family Medicine

## 2023-09-27 DIAGNOSIS — Z87891 Personal history of nicotine dependence: Secondary | ICD-10-CM

## 2023-10-17 ENCOUNTER — Ambulatory Visit
Admission: RE | Admit: 2023-10-17 | Discharge: 2023-10-17 | Disposition: A | Discharge status: Home / Self Care | Source: Ambulatory Visit | Attending: Family Medicine | Admitting: Family Medicine

## 2023-10-17 DIAGNOSIS — Z87891 Personal history of nicotine dependence: Secondary | ICD-10-CM | POA: Diagnosis present

## 2023-10-28 ENCOUNTER — Ambulatory Visit: Admitting: Urology

## 2023-11-23 ENCOUNTER — Ambulatory Visit: Admitting: Urology

## 2023-12-27 ENCOUNTER — Ambulatory Visit: Admitting: Urology

## 2024-01-04 ENCOUNTER — Ambulatory Visit: Admitting: Urology

## 2024-01-04 ENCOUNTER — Encounter: Payer: Self-pay | Admitting: Urology

## 2024-01-04 VITALS — BP 150/85 | HR 84 | Ht 72.0 in | Wt 245.0 lb

## 2024-01-04 DIAGNOSIS — N5201 Erectile dysfunction due to arterial insufficiency: Secondary | ICD-10-CM | POA: Diagnosis not present

## 2024-01-04 NOTE — Progress Notes (Signed)
 01/04/2024 2:58 PM   Jason Baxter 01-Jul-1955 [id number]  Referring provider: Cecillia Landry RAMAN, MD [address] [place],  [place] [address]  Chief Complaint  Patient presents with   Erectile Dysfunction    HPI: Jason Baxter is a 68 y.o. male referred for evaluation of erectile dysfunction.  Difficulty achieving and maintaining an erection.  Erections typically firm enough for penetration 50% of the time and is able to maintain the erection 50% of the time. SHIM 18/35 indicating mild ED Organic risk factors include hypertension, antihypertensive medications, hyperlipidemia, diabetes and tobacco use Has been prescribed sildenafil 25 mg which is partially effective.  No side effects  PMH: Past Medical History:  Diagnosis Date   Cognitive impairment    Depression    Dyslipidemia    Hyperlipemia    Low back pain    Pre-diabetes     Surgical History: Past Surgical History:  Procedure Laterality Date   COLONOSCOPY WITH PROPOFOL  N/A 04/14/2016   Procedure: COLONOSCOPY WITH PROPOFOL ;  Surgeon: Jason ONEIDA Holmes, MD;  Location: [facility] ENDOSCOPY;  Service: Endoscopy;  Laterality: N/A;   COLONOSCOPY WITH PROPOFOL  N/A 04/21/2016   Procedure: COLONOSCOPY WITH PROPOFOL ;  Surgeon: Jason ONEIDA Holmes, MD;  Location: [facility] ENDOSCOPY;  Service: Endoscopy;  Laterality: N/A;   COLONOSCOPY WITH PROPOFOL  N/A 04/21/2016   Procedure: COLONOSCOPY WITH PROPOFOL ;  Surgeon: Jason ONEIDA Holmes, MD;  Location: [facility] ENDOSCOPY;  Service: Endoscopy;  Laterality: N/A;   COLONOSCOPY WITH PROPOFOL  N/A 12/18/2018   Procedure: COLONOSCOPY WITH PROPOFOL ;  Surgeon: Toledo, Ladell POUR, MD;  Location: [facility] ENDOSCOPY;  Service: Gastroenterology;  Laterality: N/A;    Home Medications:  Allergies as of 01/04/2024   No Known Allergies      Medication List        Accurate as of January 04, 2024  2:58 PM. If you have any questions, ask your nurse or doctor.          acetaminophen 500 MG tablet Commonly known as:  TYLENOL Take 500 mg by mouth every 6 (six) hours as needed.   aspirin EC 81 MG tablet Take 81 mg by mouth daily.   hydrochlorothiazide 12.5 MG capsule Commonly known as: MICROZIDE Take 12.5 mg by mouth daily.   naproxen sodium 220 MG tablet Commonly known as: ALEVE Take 220 mg by mouth 2 (two) times daily with a meal.   polyethylene glycol powder 17 GM/SCOOP powder Commonly known as: GLYCOLAX/MIRALAX Take 1 Container by mouth once.   tamsulosin  0.4 MG Caps capsule Commonly known as: FLOMAX  Take 1 capsule (0.4 mg total) by mouth daily after supper.        Allergies: No Known Allergies  Family History: No family history on file.  Social History:  reports that he has been smoking pipe. He has never used smokeless tobacco. He reports that he does not drink alcohol and does not use drugs.   Physical Exam: BP (!) 150/85   Pulse 84   Ht 6' (1.829 m)   Wt 245 lb (111.1 kg)   SpO2 90%   BMI 33.23 kg/m   Constitutional:  Alert, No acute distress. HEENT: [place] AT Respiratory: Normal respiratory effort, no increased work of breathing. Psychiatric: Normal mood and affect.    Assessment & Plan:    1.  Erectile dysfunction Positive organic risk factors Sildenafil 25 mg partially effective Rx sildenafil 100 mg sent to pharmacy Instructed to call back if not effective   Jason JAYSON Barba, MD  [facility] [address]  [address] [place], [place] [address] [phone]

## 2024-01-09 ENCOUNTER — Encounter: Payer: Self-pay | Admitting: Urology

## 2024-01-11 ENCOUNTER — Other Ambulatory Visit: Payer: Self-pay

## 2024-01-11 MED ORDER — SILDENAFIL CITRATE 100 MG PO TABS: 100.0000 mg | ORAL_TABLET | Freq: Every day | ORAL | 0 refills | Status: AC | PRN

## 2024-02-07 ENCOUNTER — Other Ambulatory Visit: Payer: Self-pay | Admitting: Family Medicine

## 2024-02-07 DIAGNOSIS — F17200 Nicotine dependence, unspecified, uncomplicated: Secondary | ICD-10-CM

## 2024-10-17 ENCOUNTER — Ambulatory Visit
